# Patient Record
Sex: Male | Born: 1955 | Race: Black or African American | Hispanic: No | Marital: Married | Smoking: Never smoker
Health system: Southern US, Community
[De-identification: ages and names within clinical notes are randomized; demographics above are authoritative.]

## PROBLEM LIST (undated history)

## (undated) DIAGNOSIS — H269 Unspecified cataract: Secondary | ICD-10-CM

## (undated) DIAGNOSIS — H409 Unspecified glaucoma: Secondary | ICD-10-CM

## (undated) DIAGNOSIS — E119 Type 2 diabetes mellitus without complications: Secondary | ICD-10-CM

## (undated) HISTORY — PX: EYE SURGERY: SHX253

## (undated) HISTORY — DX: Unspecified cataract: H26.9

## (undated) HISTORY — PX: KNEE ARTHROSCOPY: SHX127

## (undated) HISTORY — PX: CATARACT EXTRACTION, BILATERAL: SHX1313

## (undated) HISTORY — PX: ORIF ORBITAL FRACTURE: SHX5312

---

## 2003-04-13 ENCOUNTER — Emergency Department (HOSPITAL_COMMUNITY): Admission: EM | Admit: 2003-04-13 | Discharge: 2003-04-13 | Payer: Self-pay | Admitting: Emergency Medicine

## 2003-04-15 ENCOUNTER — Observation Stay (HOSPITAL_COMMUNITY): Admission: AD | Admit: 2003-04-15 | Discharge: 2003-04-16 | Payer: Self-pay | Admitting: Orthopedic Surgery

## 2009-02-16 ENCOUNTER — Ambulatory Visit (HOSPITAL_COMMUNITY): Admission: AD | Admit: 2009-02-16 | Discharge: 2009-02-17 | Payer: Self-pay | Admitting: Orthopedic Surgery

## 2010-09-25 LAB — HEMOGLOBIN AND HEMATOCRIT, BLOOD
HCT: 38.4 % — ABNORMAL LOW (ref 39.0–52.0)
Hemoglobin: 13 g/dL (ref 13.0–17.0)

## 2010-11-02 NOTE — Op Note (Signed)
NAMEJOVONTA, Randy Mcbride              ACCOUNT NO.:  [id number]   MEDICAL RECORD NO.:  [id number]          PATIENT TYPE:  AMB   LOCATION:  DAY                          FACILITY:  [facility]   PHYSICIAN:  Marlowe Kays, M.D.  DATE OF BIRTH:  May 11, 1956   DATE OF PROCEDURE:  02/16/2009  DATE OF DISCHARGE:                               OPERATIVE REPORT   PREOPERATIVE DIAGNOSIS:  Torn patellar tendon, left knee.   POSTOPERATIVE DIAGNOSIS:  Torn patellar tendon, left knee.   OPERATION:  Repair of torn patellar tendon, left knee.   SURGEON:  Marlowe Kays, M.D.   ASSISTANT:  Georges Lynch. Darrelyn Hillock, M.D.   ANESTHESIA:  General.   INDICATIONS:  The injury actually occurred on 08/27 and he was seen at  an outpatient facility locally.  I was on call that night and was  contacted and saw him early this morning with plans to repair the tendon  this afternoon.  This afternoon.   DESCRIPTION OF PROCEDURE:  Prophylactic antibiotics, satisfactory  general anesthesia, pneumatic tourniquet applied, leg was esmarched out  non-sterilely and tourniquet inflated to 350 mmHg.  Left leg was then  prepped from tourniquet to ankle with DuraPrep.  Sterile field.  Vertical midline incision from about the quadriceps tendon insertion to  down to the tibial tubercle.  Decision was carried down to the patella  and the badly disrupted patellar tendon.  There was small remnants left  on the patella.  I first elevated up the remaining tissue on the patella  and scraped the inferior pole of patella with small rongeur, leaving the  articular surface intact.  I then made for __________ drill holes,  roughly spaced evenly across the inferior portion of the patella, coming  out superiorly.  I wove a #2 Ethibond suture with free #2 Ethibond  sutures through the patellar tendon stump distally and then using the  beef pin, placed one thread through each of the lateral most holes and  two threads through the two center holes  along with one portion of  thread from each of the lateral holes so that we could cinch down the  tendon proximally with four threads through three holes.  We were able  to cinch down the tendon nicely at the superior patella.  Patella was  checked and was not riding high.  Then irrigated it well with sterile  saline.  We closed the retinaculum laterally and remnants of the  patellar tendon in the midline smoothing down the patellar tendon and  retinaculum with a stable repair, all with #1 Vicryl.  Subcutaneous  tissue was then closed with 2-0 Vicryl staples in the skin.  Adaptic dry  sterile dressing with a well-padded fiberglass cast were applied.  Tourniquet was released with the 68 minutes of tourniquet time having  elapsed.  He tolerated the procedure well, was taken to the recovery  room in satisfactory condition with no known complications and no blood  loss.          ______________________________  Marlowe Kays, M.D.    JA/MEDQ  D:  02/16/2009  T:  02/16/2009  Job:  [id number]

## 2010-11-05 NOTE — Op Note (Signed)
NAME:  Randy Mcbride, Randy Mcbride                        ACCOUNT NO.:  [id number]   MEDICAL RECORD NO.:  [id number]                   PATIENT TYPE:  OBV   LOCATION:  0471                                 FACILITY:  [facility]   PHYSICIAN:  Almedia Balls. Ranell Patrick, M.D.              DATE OF BIRTH:  [birth year]   DATE OF PROCEDURE:  04/15/2003  DATE OF DISCHARGE:                                 OPERATIVE REPORT   PREOPERATIVE DIAGNOSIS:  Right quadriceps rupture.   POSTOPERATIVE DIAGNOSIS:  Right quadriceps rupture.   PROCEDURE:  Right open quadriceps repair.   SURGEON:  Almedia Balls. Ranell Patrick, M.D.   FIRST ASSISTANT:  None.   ANESTHESIA:  General anesthesia was used.   ESTIMATED BLOOD LOSS:  Zero.   TOURNIQUET TIME:  56 minutes.   INSTRUMENT COUNT:  Correct.   COMPLICATIONS:  None.   ANTIBIOTICS:  Perioperative antibiotics were given.   INDICATIONS FOR PROCEDURE:  The patient is a 55 year old male who sustained  an injury to his right knee. The patient was complaining of immediate pain  and inability to walk. The patient has inability to actively extend his knee  in the office and a palpable defect is present in the superior pole of the  patella consistent with quadriceps rupture. Plain films are negative.  Informed consent was obtained.   DESCRIPTION OF PROCEDURE:  After an adequate level anesthesia was achieved,  the patient was positioned supine on the operating room table and a  nonsterile tourniquet was placed on the right proximal thigh, right leg was  prepped and draped in its entirety in the usual sterile fashion. After  exsanguination of the limb using an Esmarch bandage, the tourniquet was  elevated to 300 mmHg, a longitudinal skin incision was created starting at  the distal aspect of the quadriceps, extending over the patella. This was in  the midline. Dissection was carried sharply down through the subcutaneous  tissue using Bovie electrocautery. The quadriceps was noted to be  completely  ruptured from the superior pole of the patella, the quadriceps tendon was  freshened up as well as the remaining soft tissue removed down off the  superior pole of the patella exposing cortical bleeding bone, three drill  holes placed proximal to distal utilizing a 2 mm drill bit to facilitate  passage of the FiberWire suture. A #5 FiberWire suture was placed in a  Krakow-type stitch up into the quadriceps tendon such that four strands were  exiting distally. These were then passed through the three drill holes such  that the mattress orientation could be tied under the tendon distally. With  the knee hyperextended and after a thorough irrigation of the knee joint,  the repair was affected. At this point, the retinaculum was repaired using  #2 FiberWire run from the deep portion of the medial gutter up to the tissue  adjacent to the patellar tendon repair. A nice low profile repair  with a  flat patella was obtained. The knee was thoroughly irrigated and then closed  using 2-0 Vicryl for subcutaneous tissues and running 4-0 Monocryl to the  skin in a subcuticular fashion. Steri-Strips were applied followed by a  sterile dressing and a long leg soft wrap. The patient was taken to the  recovery room after deflating the tourniquet for six minutes in stable  condition and extubated.                                               Almedia Balls. Ranell Patrick, M.D.    SRN/MEDQ  D:  04/15/2003  T:  04/15/2003  Job:  [id number]

## 2011-08-04 ENCOUNTER — Other Ambulatory Visit: Payer: Self-pay | Admitting: Family Medicine

## 2011-08-04 ENCOUNTER — Other Ambulatory Visit: Payer: Self-pay | Admitting: Physician Assistant

## 2012-04-13 ENCOUNTER — Other Ambulatory Visit: Payer: Self-pay | Admitting: Physician Assistant

## 2012-04-13 NOTE — Telephone Encounter (Signed)
Chart pulled to PA pool at nurses station [phone]

## 2012-04-14 ENCOUNTER — Other Ambulatory Visit: Payer: Self-pay | Admitting: Physician Assistant

## 2013-11-06 ENCOUNTER — Encounter (HOSPITAL_COMMUNITY): Payer: Self-pay | Admitting: Emergency Medicine

## 2013-11-06 ENCOUNTER — Emergency Department (HOSPITAL_COMMUNITY)
Admission: EM | Admit: 2013-11-06 | Discharge: 2013-11-06 | Disposition: A | Discharge status: Nursing Home (Includes ICF) | Payer: 59 | Source: Home / Self Care

## 2013-11-06 DIAGNOSIS — R32 Unspecified urinary incontinence: Secondary | ICD-10-CM

## 2013-11-06 DIAGNOSIS — R519 Headache, unspecified: Secondary | ICD-10-CM

## 2013-11-06 DIAGNOSIS — R51 Headache: Secondary | ICD-10-CM

## 2013-11-06 DIAGNOSIS — R269 Unspecified abnormalities of gait and mobility: Secondary | ICD-10-CM | POA: Insufficient documentation

## 2013-11-06 DIAGNOSIS — F1010 Alcohol abuse, uncomplicated: Secondary | ICD-10-CM | POA: Insufficient documentation

## 2013-11-06 DIAGNOSIS — E119 Type 2 diabetes mellitus without complications: Secondary | ICD-10-CM | POA: Insufficient documentation

## 2013-11-06 DIAGNOSIS — R42 Dizziness and giddiness: Secondary | ICD-10-CM

## 2013-11-06 HISTORY — DX: Type 2 diabetes mellitus without complications: E11.9

## 2013-11-06 HISTORY — DX: Unspecified glaucoma: H40.9

## 2013-11-06 LAB — CBC WITH DIFFERENTIAL/PLATELET
Basophils Absolute: 0 10*3/uL (ref 0.0–0.1)
Basophils Relative: 0 % (ref 0–1)
Eosinophils Absolute: 0.1 10*3/uL (ref 0.0–0.7)
Eosinophils Relative: 2 % (ref 0–5)
HCT: 42.2 % (ref 39.0–52.0)
Hemoglobin: 14 g/dL (ref 13.0–17.0)
Lymphocytes Relative: 43 % (ref 12–46)
Lymphs Abs: 2.8 10*3/uL (ref 0.7–4.0)
MCH: 29 pg (ref 26.0–34.0)
MCHC: 33.2 g/dL (ref 30.0–36.0)
MCV: 87.6 fL (ref 78.0–100.0)
Monocytes Absolute: 0.3 10*3/uL (ref 0.1–1.0)
Monocytes Relative: 5 % (ref 3–12)
Neutro Abs: 3.3 10*3/uL (ref 1.7–7.7)
Neutrophils Relative %: 50 % (ref 43–77)
Platelets: 353 10*3/uL (ref 150–400)
RBC: 4.82 MIL/uL (ref 4.22–5.81)
RDW: 11.5 % (ref 11.5–15.5)
WBC: 6.5 10*3/uL (ref 4.0–10.5)

## 2013-11-06 LAB — BASIC METABOLIC PANEL
BUN: 10 mg/dL (ref 6–23)
CO2: 25 mEq/L (ref 19–32)
Calcium: 9.1 mg/dL (ref 8.4–10.5)
Chloride: 103 mEq/L (ref 96–112)
Creatinine, Ser: 0.94 mg/dL (ref 0.50–1.35)
GFR calc Af Amer: >90 mL/min (ref >90)
GFR calc non Af Amer: 89 mL/min — ABNORMAL LOW (ref >90)
Glucose, Bld: 228 mg/dL — ABNORMAL HIGH (ref 70–99)
Potassium: 4 mEq/L (ref 3.7–5.3)
Sodium: 141 mEq/L (ref 137–147)

## 2013-11-06 LAB — CBG MONITORING, ED: Glucose-Capillary: 192 mg/dL — ABNORMAL HIGH (ref 70–99)

## 2013-11-06 NOTE — ED Notes (Addendum)
Pt's wife brings pt b/c she found him on the floor unresponsive Pt awoke and urinated on himself and was c/o HA and feeling dizzy Pt reports he was drinking "2-3 Gin"?? Pt reports he feels fine and wants to go home but is here b/c of wife.  He is alert w/some slurry speech; no signs of acute distress.

## 2013-11-06 NOTE — ED Notes (Addendum)
[facility], slurred speech and headache that began after drinking a pint of gin today around 5 pm. WIfe was conerned and wanted him to be evaluated because staggering, slurred speech and headache are uncommon for him. Pt denies complaints, he is alert and oreinted., MAE x4 no slurred speech or facial droop. Denies headaches.  Wife reports he urinated on himself and c/o of headache she became concerned.  PT reports htat he does not usually drink that much. Also reports cough for 3 weeks.  [facility]

## 2013-11-06 NOTE — ED Provider Notes (Signed)
CSN: [id number]     Arrival date & time 11/06/13  1957 History   None    Chief Complaint  Patient presents with  . Alcohol Intoxication   (Consider location/radiation/quality/duration/timing/severity/associated sxs/prior Treatment) HPI  {Pt states he is here because his wife brought him here. Pt is complaining of HA, dizziness, and slurring words, and urinated on himself. Endorses drinking a pint of gin and denies. Wife states pt has problems with being honest.   Past Medical History  Diagnosis Date  . Glaucoma   . Diabetes mellitus without complication    Past Surgical History  Procedure Laterality Date  . Knee arthroscopy     No family history on file. History  Substance Use Topics  . Smoking status: Never Smoker   . Smokeless tobacco: Not on file  . Alcohol Use: Yes    Review of Systems  Respiratory: Negative for shortness of breath.   Cardiovascular: Negative for chest pain.  Neurological: Positive for dizziness and headaches. Negative for seizures, syncope, light-headedness and numbness.  All other systems reviewed and are negative.   Allergies  Review of patient's allergies indicates no known allergies.  Home Medications   Prior to Admission medications   Medication Sig Start Date End Date Taking? Authorizing Provider  ONE TOUCH ULTRA TEST test strip AS DIRECTED 04/13/12   Sondra Bargesyan M Dunn, PA-C  ONETOUCH DELICA LANCETS MISC USE  STRIP TO CHECK GLUCOSE TWICE DAILY 08/04/11   Morrell RiddleSarah L Weber, PA-C   There were no vitals taken for this visit. Physical Exam  Constitutional: He is oriented to person, place, and time. He appears well-developed and well-nourished. No distress.  HENT:  Head: Normocephalic and atraumatic.  Eyes: EOM are normal. Pupils are equal, round, and reactive to light.  Neck: Normal range of motion.  Cardiovascular: Normal rate and intact distal pulses.   Pulmonary/Chest: Effort normal. No respiratory distress.  Abdominal: Soft. He exhibits no  distension.  Musculoskeletal: Normal range of motion. He exhibits no edema.  Neurological: He is alert and oriented to person, place, and time. No cranial nerve deficit. He exhibits normal muscle tone. Coordination normal.  Wobbly on feet w/ wide gait but no central ataxia.  Upper and lower extremities w/ 5/5 strength.   Skin: Skin is warm. He is not diaphoretic. No erythema.  Psychiatric: He has a normal mood and affect. His behavior is normal. Judgment and thought content normal.    ED Course  Procedures (including critical care time) Labs Review Labs Reviewed - No data to display  Imaging Review No results found.   MDM   1. Headache   2. Incontinence   3. Dizziness and giddiness    Pt condition most concerning for ETOH and other possible drug use. Discordant history per pt and pts wife. No sign of acute ICP/SZR/Stroke.  [facility]   Shelly Flattenavid Merrell, MD Family Medicine PGY-3 11/06/2013, 8:42 PM      Ozella Rocksavid J Merrell, MD 11/06/13 [phone]

## 2013-11-07 ENCOUNTER — Emergency Department (HOSPITAL_COMMUNITY)
Admission: EM | Admit: 2013-11-07 | Discharge: 2013-11-07 | Discharge status: Against Medical Advice | Payer: 59 | Attending: Emergency Medicine | Admitting: Emergency Medicine

## 2013-11-07 NOTE — ED Notes (Signed)
Pt did not answer when name called. Unable to find in waiting room

## 2013-11-07 NOTE — ED Provider Notes (Signed)
Medical screening examination/treatment/procedure(s) were performed by a resident physician or non-physician practitioner and as the supervising physician I was immediately available for consultation/collaboration.  Joylyn Duggin, MD    Shade Kaley S Rosaline Ezekiel, MD 11/07/13 0751 

## 2013-12-25 ENCOUNTER — Ambulatory Visit (INDEPENDENT_AMBULATORY_CARE_PROVIDER_SITE_OTHER): Payer: 59 | Admitting: Family Medicine

## 2013-12-25 VITALS — BP 104/80 | HR 70 | Temp 97.8°F | Resp 16 | Ht 67.25 in | Wt 184.0 lb

## 2013-12-25 DIAGNOSIS — E1165 Type 2 diabetes mellitus with hyperglycemia: Secondary | ICD-10-CM

## 2013-12-25 DIAGNOSIS — E11319 Type 2 diabetes mellitus with unspecified diabetic retinopathy without macular edema: Secondary | ICD-10-CM | POA: Insufficient documentation

## 2013-12-25 DIAGNOSIS — E119 Type 2 diabetes mellitus without complications: Secondary | ICD-10-CM | POA: Insufficient documentation

## 2013-12-25 DIAGNOSIS — E118 Type 2 diabetes mellitus with unspecified complications: Principal | ICD-10-CM

## 2013-12-25 DIAGNOSIS — IMO0002 Reserved for concepts with insufficient information to code with codable children: Secondary | ICD-10-CM

## 2013-12-25 LAB — POCT GLYCOSYLATED HEMOGLOBIN (HGB A1C): Hemoglobin A1C: 7.6

## 2013-12-25 MED ORDER — ONETOUCH ULTRASOFT LANCETS MISC: Status: DC

## 2013-12-25 MED ORDER — GLUCOSE BLOOD VI STRP: ORAL_STRIP | Status: DC

## 2013-12-25 MED ORDER — METFORMIN HCL 500 MG PO TABS: 500.0000 mg | ORAL_TABLET | Freq: Every day | ORAL | Status: DC

## 2013-12-25 NOTE — Progress Notes (Signed)
[facility] [address], [place] [address] [phone]  Date:  12/25/2013   Name:  Randy MemosRonald D Ney   DOB:  02/10/1956   MRN:  [id number]  PCP:  No PCP Per Patient    Chief Complaint: Annual Exam   History of Present Illness:  Randy MemosRonald D Jacuinde is a 58 y.o. very pleasant male patient who presents with the following:  He is here today for a CPE/ to establish care.  He is a new patient.  He has a history of DM but is not currently on any meds.  Took metformin in the past He also does have glaucoma, but has not seen an eye doctor in a couple of years either.  He does still have his drops.    It looks like he was in the ER back in May after being found ?intoxicated by his wife at home.  He had some labs then which looked ok except for hyperglycemia.  He is fasting today- since 10pm.   He is otherwise generally healthy per his knowledge.   There are no active problems to display for this patient.   Past Medical History  Diagnosis Date  . Glaucoma   . Diabetes mellitus without complication   . Cataract     Past Surgical History  Procedure Laterality Date  . Knee arthroscopy    . Eye surgery      History  Substance Use Topics  . Smoking status: Never Smoker   . Smokeless tobacco: Not on file  . Alcohol Use: Yes    Family History  Problem Relation Age of Onset  . Diabetes Mother   . Stroke Father   . Diabetes Maternal Grandmother   . Hypertension Paternal Grandfather     No Known Allergies  Medication list has been reviewed and updated.  Current Outpatient Prescriptions on File Prior to Visit  Medication Sig Dispense Refill  . ONE TOUCH ULTRA TEST test strip AS DIRECTED  32 each  0  . ONETOUCH DELICA LANCETS MISC USE  STRIP TO CHECK GLUCOSE TWICE DAILY  100 each  0   No current facility-administered medications on file prior to visit.    Review of Systems:  As per HPI- otherwise negative.   Physical Examination: Filed Vitals:   12/25/13 1343  BP: 104/80  Pulse: 70  Temp: 97.8 F (36.6 C)  Resp: 16   Filed Vitals:   12/25/13 1343  Height: 5' 7.25" (1.708 m)  Weight: 184 lb (83.462 kg)   Body mass index is 28.61 kg/(m^2). Ideal Body Weight: Weight in (lb) to have BMI = 25: 160.5  GEN: WDWN, NAD, Non-toxic, A & O x 3, looks well HEENT: Atraumatic, Normocephalic. Neck supple. No masses, No LAD. Ears and Nose: No external deformity. CV: RRR, No M/G/R. No JVD. No thrill. No extra heart sounds. PULM: CTA B, no wheezes, crackles, rhonchi. No retractions. No resp. distress. No accessory muscle use. ABD: S, NT, ND EXTR: No c/c/e NEURO Normal gait.  PSYCH: Normally interactive. Conversant. Not depressed or anxious appearing.  Calm demeanor.  Gu: normal exam, no masses or lesions.    Results for orders placed in visit on 12/25/13  POCT GLYCOSYLATED HEMOGLOBIN (HGB A1C)      Result Value Ref Range   Hemoglobin A1C 7.6      Assessment and Plan: Type II or unspecified type diabetes mellitus with unspecified complication, uncontrolled - Plan: POCT glycosylated hemoglobin (Hb A1C), Comprehensive metabolic panel, Lipid  panel, PSA, metFORMIN (GLUCOPHAGE) 500 MG tablet  DM- off meds but A1c is not bad.  Restart metformin 500 mg a day, and await other labs.  Plan follow-up in 3-4 months.   Signed Abbe AmsterdamJessica Jerae Izard, MD

## 2013-12-25 NOTE — Patient Instructions (Signed)
Good to see you today.  Start back on metformin 500 mg once a day, and please come and see me in 3 or 4 months for a recheck. I will be in touch with the rest of your labs.

## 2013-12-25 NOTE — Addendum Note (Signed)
Addended by: Abbe AmsterdamOPLAND, Tamika Nou C on: 12/25/2013 03:46 PM   Modules accepted: Orders

## 2013-12-26 ENCOUNTER — Encounter: Payer: Self-pay | Admitting: Family Medicine

## 2013-12-26 LAB — COMPREHENSIVE METABOLIC PANEL
ALT: 22 U/L (ref 0–53)
AST: 18 U/L (ref 0–37)
Albumin: 4.3 g/dL (ref 3.5–5.2)
Alkaline Phosphatase: 70 U/L (ref 39–117)
BUN: 13 mg/dL (ref 6–23)
CO2: 26 mEq/L (ref 19–32)
Calcium: 9.4 mg/dL (ref 8.4–10.5)
Chloride: 99 mEq/L (ref 96–112)
Creat: 0.94 mg/dL (ref 0.50–1.35)
Glucose, Bld: 164 mg/dL — ABNORMAL HIGH (ref 70–99)
Potassium: 4.6 mEq/L (ref 3.5–5.3)
Sodium: 135 mEq/L (ref 135–145)
Total Bilirubin: 0.7 mg/dL (ref 0.2–1.2)
Total Protein: 7 g/dL (ref 6.0–8.3)

## 2013-12-26 LAB — LIPID PANEL
Cholesterol: 201 mg/dL — ABNORMAL HIGH (ref 0–200)
HDL: 43 mg/dL (ref >39)
LDL Cholesterol: 143 mg/dL — ABNORMAL HIGH (ref 0–99)
Total CHOL/HDL Ratio: 4.7 Ratio
Triglycerides: 75 mg/dL (ref <150)
VLDL: 15 mg/dL (ref 0–40)

## 2013-12-26 LAB — PSA: PSA: 0.72 ng/mL (ref <=4.00)

## 2014-01-08 ENCOUNTER — Ambulatory Visit: Payer: Self-pay | Admitting: Family Medicine

## 2014-04-14 ENCOUNTER — Ambulatory Visit: Payer: 59 | Admitting: Family Medicine

## 2014-06-09 ENCOUNTER — Ambulatory Visit (INDEPENDENT_AMBULATORY_CARE_PROVIDER_SITE_OTHER): Payer: 59 | Admitting: Family Medicine

## 2014-06-09 VITALS — BP 144/88 | HR 84 | Temp 98.6°F | Resp 18 | Ht 67.25 in | Wt 186.6 lb

## 2014-06-09 DIAGNOSIS — N481 Balanitis: Secondary | ICD-10-CM

## 2014-06-09 MED ORDER — FLUCONAZOLE 150 MG PO TABS: 150.0000 mg | ORAL_TABLET | Freq: Once | ORAL | Status: DC

## 2014-06-09 MED ORDER — KETOCONAZOLE 2 % EX CREA: 1.0000 "application " | TOPICAL_CREAM | Freq: Two times a day (BID) | CUTANEOUS | Status: DC

## 2014-06-09 NOTE — Progress Notes (Signed)
Is a 58 year old retired man who has diabetes and presents with chronic balanitis for a year. He's been checking his sugar and found it to be well controlled. The balanitis is intermittent but has been worse lately. He is able to retract his foreskin.  Objective: No acute distress Examination of the penis moderate erythema over the foreskin and glans with some fissuring as he retracts the foreskin.  This chart was scribed in my presence and reviewed by me personally.    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

## 2014-06-09 NOTE — Patient Instructions (Signed)
This is a mild fungal infection commonly seen in diabetes.    Balanitis Balanitis is inflammation of the head of the penis (glans).  CAUSES  Balanitis has multiple causes, both infectious and noninfectious. Often balanitis is the result of poor personal hygiene, especially in uncircumcised males. Without adequate washing, viruses, bacteria, and yeast collect between the foreskin and the glans. This can cause an infection. Lack of air and irritation from a normal secretion called smegma contribute to the cause in uncircumcised males. Other causes include:  Chemical irritation from the use of certain soaps and shower gels (especially soaps with perfumes), condoms, personal lubricants, petroleum jelly, spermicides, and fabric conditioners.  Skin conditions, such as eczema, dermatitis, and psoriasis.  Allergies to drugs, such as tetracycline and sulfa.  Certain medical conditions, including liver cirrhosis, congestive heart failure, and kidney disease.  Morbid obesity. RISK FACTORS  Diabetes mellitus.  A tight foreskin that is difficult to pull back past the glans (phimosis).  Sex without the use of a condom. SIGNS AND SYMPTOMS  Symptoms may include:  Discharge coming from under the foreskin.  Tenderness.  Itching and inability to get an erection (because of the pain).  Redness and a rash.  Sores on the glans and on the foreskin. DIAGNOSIS Diagnosis of balanitis is confirmed through a physical exam. TREATMENT The treatment is based on the cause of the balanitis. Treatment may include:  Frequent cleansing.  Keeping the glans and foreskin dry.  Use of medicines such as creams, pain medicines, antibiotics, or medicines to treat fungal infections.  Sitz baths. If the irritation has caused a scar on the foreskin that prevents easy retraction, a circumcision may be recommended.  HOME CARE INSTRUCTIONS  Sex should be avoided until the condition has cleared. MAKE SURE  YOU:  Understand these instructions.  Will watch your condition.  Will get help right away if you are not doing well or get worse. Document Released: 10/23/2008 Document Revised: 06/11/2013 Document Reviewed: 11/26/2012 [facility] Patient Information 2015 [employer] [place]. This information is not intended to replace advice given to you by your health care provider. Make sure you discuss any questions you have with your health care provider.

## 2014-10-01 DIAGNOSIS — H401133 Primary open-angle glaucoma, bilateral, severe stage: Secondary | ICD-10-CM | POA: Insufficient documentation

## 2014-11-13 ENCOUNTER — Ambulatory Visit (INDEPENDENT_AMBULATORY_CARE_PROVIDER_SITE_OTHER): Payer: 59 | Admitting: Family Medicine

## 2014-11-13 VITALS — BP 130/80 | HR 64 | Temp 97.8°F | Resp 18 | Ht 67.75 in | Wt 177.8 lb

## 2014-11-13 DIAGNOSIS — E1165 Type 2 diabetes mellitus with hyperglycemia: Secondary | ICD-10-CM | POA: Diagnosis not present

## 2014-11-13 DIAGNOSIS — Z130 Encounter for screening for diseases of the blood and blood-forming organs and certain disorders involving the immune mechanism: Secondary | ICD-10-CM | POA: Diagnosis not present

## 2014-11-13 DIAGNOSIS — Z23 Encounter for immunization: Secondary | ICD-10-CM | POA: Diagnosis not present

## 2014-11-13 DIAGNOSIS — Z13220 Encounter for screening for lipoid disorders: Secondary | ICD-10-CM | POA: Diagnosis not present

## 2014-11-13 DIAGNOSIS — IMO0002 Reserved for concepts with insufficient information to code with codable children: Secondary | ICD-10-CM

## 2014-11-13 DIAGNOSIS — Z1211 Encounter for screening for malignant neoplasm of colon: Secondary | ICD-10-CM | POA: Diagnosis not present

## 2014-11-13 DIAGNOSIS — Z119 Encounter for screening for infectious and parasitic diseases, unspecified: Secondary | ICD-10-CM

## 2014-11-13 DIAGNOSIS — Z125 Encounter for screening for malignant neoplasm of prostate: Secondary | ICD-10-CM

## 2014-11-13 DIAGNOSIS — Z0000 Encounter for general adult medical examination without abnormal findings: Secondary | ICD-10-CM | POA: Diagnosis not present

## 2014-11-13 LAB — CBC
HCT: 39.9 % (ref 39.0–52.0)
Hemoglobin: 13.4 g/dL (ref 13.0–17.0)
MCH: 29.4 pg (ref 26.0–34.0)
MCHC: 33.6 g/dL (ref 30.0–36.0)
MCV: 87.5 fL (ref 78.0–100.0)
MPV: 10.6 fL (ref 8.6–12.4)
Platelets: 293 10*3/uL (ref 150–400)
RBC: 4.56 MIL/uL (ref 4.22–5.81)
RDW: 12.4 % (ref 11.5–15.5)
WBC: 4.6 10*3/uL (ref 4.0–10.5)

## 2014-11-13 LAB — POCT GLYCOSYLATED HEMOGLOBIN (HGB A1C): Hemoglobin A1C: 6.1

## 2014-11-13 LAB — LIPID PANEL
Cholesterol: 155 mg/dL (ref 0–200)
HDL: 44 mg/dL (ref >=40)
LDL Cholesterol: 90 mg/dL (ref 0–99)
Total CHOL/HDL Ratio: 3.5 Ratio
Triglycerides: 105 mg/dL (ref <150)
VLDL: 21 mg/dL (ref 0–40)

## 2014-11-13 LAB — COMPREHENSIVE METABOLIC PANEL
ALT: 22 U/L (ref 0–53)
AST: 20 U/L (ref 0–37)
Albumin: 4.1 g/dL (ref 3.5–5.2)
Alkaline Phosphatase: 59 U/L (ref 39–117)
BUN: 11 mg/dL (ref 6–23)
CO2: 22 mEq/L (ref 19–32)
Calcium: 9.4 mg/dL (ref 8.4–10.5)
Chloride: 104 mEq/L (ref 96–112)
Creat: 0.9 mg/dL (ref 0.50–1.35)
Glucose, Bld: 160 mg/dL — ABNORMAL HIGH (ref 70–99)
Potassium: 4.4 mEq/L (ref 3.5–5.3)
Sodium: 139 mEq/L (ref 135–145)
Total Bilirubin: 0.5 mg/dL (ref 0.2–1.2)
Total Protein: 6.7 g/dL (ref 6.0–8.3)

## 2014-11-13 LAB — MICROALBUMIN, URINE: Microalb, Ur: 0.4 mg/dL (ref <2.0)

## 2014-11-13 LAB — HIV ANTIBODY (ROUTINE TESTING W REFLEX): HIV 1&2 Ab, 4th Generation: NONREACTIVE

## 2014-11-13 NOTE — Patient Instructions (Signed)
Great to see you today!  Your A1c looks a lot better- continue to take the metformin as you are right now I will be in touch with the rest of your labs I will also refer you to GI because you are due for a colonoscopy (screening test for colon cancer)   As a diabetic, there are several things you can do to monitor your condition and maintain your health.  1. Check your feet daily for any skin breakdown 2. Exercise and keep track of your diet 3. Let us know before you run out of your medications 4. Get your annual flu shot, and ask if you need a pneumonia shot 5. Ask if you are up to date on your labs; you should have an A1c every 6 months, a urine protein test annually, and a cholesterol test annually.  Your doctor may decide to do labs more often if indicated 6. Take off your shoes and socks at each visit.  Be sure your doctor examines your feet.   7. Ask about your blood pressure.  Your goal is 130/ 80 or less 8. Get an annual eye exam.  Please ask your ophthalmologist to send us your report 9. Keep up with your dental cleanings and exams.

## 2014-11-13 NOTE — Progress Notes (Signed)
[facility] [address] [place] [address] [phone]  Date:  11/13/2014   Name:  Randy Mcbride   DOB:  1956/06/10   MRN:  [id number]  PCP:  Abbe Amsterdam, MD    Chief Complaint: Annual Exam   History of Present Illness:  Randy Mcbride is a 59 y.o. very pleasant male patient who presents with the following:  Here today or a CPE.  He is fasting today for labs  History of DM and also eye problems- he sees his ophthalmologist regularly.  Some sort of operation planned for next week- ?cataract His fasting glucose is between 110- 140 He was seen by myself once in July- he has not followed up since.  His A1c then was 7.6 on no medictaion.  He is now taking metformin once a day.    He has not yet had a pneumonia vaccine and tetanus is overdue.   He is not a smoker He is not drinking currently Colonoscopy in 2014  Patient Active Problem List   Diagnosis Date Noted  . Type II or unspecified type diabetes mellitus with unspecified complication, uncontrolled 12/25/2013    Past Medical History  Diagnosis Date  . Glaucoma   . Diabetes mellitus without complication   . Cataract     Past Surgical History  Procedure Laterality Date  . Knee arthroscopy    . Eye surgery      History  Substance Use Topics  . Smoking status: Never Smoker   . Smokeless tobacco: Not on file  . Alcohol Use: 0.0 oz/week    0 Standard drinks or equivalent per week    Family History  Problem Relation Age of Onset  . Diabetes Mother   . Stroke Father   . Diabetes Maternal Grandmother   . Hypertension Paternal Grandfather     No Known Allergies  Medication list has been reviewed and updated.  Current Outpatient Prescriptions on File Prior to Visit  Medication Sig Dispense Refill  . dorzolamide-timolol (COSOPT) 22.3-6.8 MG/ML ophthalmic solution Place 1 drop into both eyes 2 (two) times daily.    . fluconazole (DIFLUCAN) 150 MG tablet Take 1 tablet (150 mg  total) by mouth once. 1 tablet 11  . glucose blood (ONE TOUCH ULTRA TEST) test strip Use daily as needed 100 each 3  . Lancets (ONETOUCH ULTRASOFT) lancets Use as instructed 100 each 12  . metFORMIN (GLUCOPHAGE) 500 MG tablet Take 1 tablet (500 mg total) by mouth daily with breakfast. 90 tablet 3  . ONETOUCH DELICA LANCETS MISC USE  STRIP TO CHECK GLUCOSE TWICE DAILY 100 each 0   No current facility-administered medications on file prior to visit.    Review of Systems:  As per HPI- otherwise negative.   Physical Examination: Filed Vitals:   11/13/14 0831  BP: 130/80  Pulse: 64  Temp: 97.8 F (36.6 C)  Resp: 18   Filed Vitals:   11/13/14 0831  Height: 5' 7.75" (1.721 m)  Weight: 177 lb 12.8 oz (80.65 kg)   Body mass index is 27.23 kg/(m^2). Ideal Body Weight: Weight in (lb) to have BMI = 25: 162.9  GEN: WDWN, NAD, Non-toxic, A & O x 3, mild overweight, looks well HEENT: Atraumatic, Normocephalic. Neck supple. No masses, No LAD. Bilateral TM wnl, oropharynx normal.  PEERL,EOMI.   Ears and Nose: No external deformity. CV: RRR, No M/G/R. No JVD. No thrill. No extra heart sounds. PULM: CTA B, no wheezes, crackles, rhonchi. No retractions.  No resp. distress. No accessory muscle use. ABD: S, NT, ND, +BS. No rebound. No HSM. EXTR: No c/c/e NEURO Normal gait.  PSYCH: Normally interactive. Conversant. Not depressed or anxious appearing.  Calm demeanor.   Foot exam normal today  Results for orders placed or performed in visit on 11/13/14  POCT glycosylated hemoglobin (Hb A1C)  Result Value Ref Range   Hemoglobin A1C 6.1     Assessment and Plan: Physical exam  Diabetes mellitus type 2, uncontrolled - Plan: Microalbumin, urine, Comprehensive metabolic panel, POCT glycosylated hemoglobin (Hb A1C)  Screening for hyperlipidemia - Plan: Lipid panel  Screening for deficiency anemia - Plan: CBC  Screening for prostate cancer - Plan: PSA  Screening examination for infectious  disease - Plan: HIV antibody  Immunization due - Plan: Tdap vaccine greater than or equal to 7yo IM, Pneumococcal polysaccharide vaccine 23-valent greater than or equal to 2yo subcutaneous/IM  Screening for colon cancer - Plan: Ambulatory referral to Gastroenterology  Caught up on health maint as above.  Labs pending.  DM control is good.  Immunizations as above Depending on his other labs plan follow-up in 6 months   Signed Abbe AmsterdamJessica Copland, MD

## 2014-11-14 ENCOUNTER — Encounter: Payer: Self-pay | Admitting: Family Medicine

## 2014-11-14 LAB — PSA: PSA: 1.3 ng/mL (ref <=4.00)

## 2015-02-14 ENCOUNTER — Other Ambulatory Visit: Payer: Self-pay | Admitting: Family Medicine

## 2015-06-05 ENCOUNTER — Other Ambulatory Visit: Payer: Self-pay | Admitting: Family Medicine

## 2015-06-09 ENCOUNTER — Telehealth: Payer: Self-pay

## 2015-06-09 NOTE — Telephone Encounter (Signed)
Patient left a message with pharmacy to have his meds refills.  Hung up without leaving additional information.

## 2015-06-09 NOTE — Telephone Encounter (Signed)
Called back metFORMIN (GLUCOPHAGE) 500 MG tablet  Been out of medication since last Friday.    [facility] [address] [place], [place] - [address]   [phone]

## 2015-06-10 NOTE — Telephone Encounter (Signed)
Rx sent in yesterday

## 2015-07-10 ENCOUNTER — Encounter: Payer: Self-pay | Admitting: Family Medicine

## 2015-07-13 ENCOUNTER — Ambulatory Visit (INDEPENDENT_AMBULATORY_CARE_PROVIDER_SITE_OTHER): Payer: Commercial Managed Care - HMO | Admitting: Physician Assistant

## 2015-07-13 VITALS — BP 154/89 | HR 72 | Temp 98.6°F | Resp 16 | Ht 68.0 in | Wt 184.0 lb

## 2015-07-13 DIAGNOSIS — E538 Deficiency of other specified B group vitamins: Secondary | ICD-10-CM | POA: Diagnosis not present

## 2015-07-13 DIAGNOSIS — E119 Type 2 diabetes mellitus without complications: Secondary | ICD-10-CM

## 2015-07-13 DIAGNOSIS — R030 Elevated blood-pressure reading, without diagnosis of hypertension: Secondary | ICD-10-CM | POA: Diagnosis not present

## 2015-07-13 DIAGNOSIS — IMO0001 Reserved for inherently not codable concepts without codable children: Secondary | ICD-10-CM

## 2015-07-13 DIAGNOSIS — H409 Unspecified glaucoma: Secondary | ICD-10-CM | POA: Insufficient documentation

## 2015-07-13 DIAGNOSIS — Z23 Encounter for immunization: Secondary | ICD-10-CM | POA: Diagnosis not present

## 2015-07-13 LAB — POCT GLYCOSYLATED HEMOGLOBIN (HGB A1C): Hemoglobin A1C: 7.4

## 2015-07-13 MED ORDER — METFORMIN HCL 500 MG PO TABS
ORAL_TABLET | ORAL | Status: DC
Start: 2015-07-13 — End: 2016-08-02

## 2015-07-13 NOTE — Progress Notes (Signed)
[facility] [address] [place] [address] [phone]  Date:  07/13/2015   Name:  Randy Mcbride   DOB:  Jan 28, 1956   MRN:  [id number]  PCP:  Abbe Amsterdam, MD    Chief Complaint: Medication Refill and Flu Vaccine   History of Present Illness:  This is a 60 y.o. male with PMH DM2, B12 deficiency, glaucoma who is presenting for follow up DM.  Takes metformin 500 mg QAM. Last A1C 11/13/14 - 6.1. microalbumin at that time normal. Normal kidney and liver function. Had pneumovax 10/2014. Had diabetic foot exam 10/2614. Last ophtho exam June 2016.  Only checks sugar when he feels bad. 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 Used to do  weight lifting after the 45 minutes classes for 20 minutes or so but states he hasn't been as good about doing this over the past 1 year. Diet - states he has slipped a little and eats more fried foods and not drinking as much water. Still eating a lot of vegetables. Has an upcoming appt with Dr. Patsy Lager in Feb. Denies paresthesias.  Lipid panel 10/2014 normal.  BP elevated today. Has not been elevated in the past. Pt is not open to taking BP meds. He denies ha, dizziness, blurred, vision, cp, sob.  Review of Systems:  Review of Systems See HPI  Patient Active Problem List   Diagnosis Date Noted  . Type II or unspecified type diabetes mellitus with unspecified complication, uncontrolled 12/25/2013    Prior to Admission medications   Medication Sig Start Date End Date Taking? Authorizing Provider  dorzolamide-timolol (COSOPT) 22.3-6.8 MG/ML ophthalmic solution Place 1 drop into both eyes 2 (two) times daily.   Yes Historical Provider, MD  glucose blood (ONE TOUCH ULTRA TEST) test strip Use daily as needed 12/25/13  Yes Gwenlyn Found Copland, MD  Lancets (ONETOUCH ULTRASOFT) lancets Use as instructed 12/25/13  Yes Gwenlyn Found Copland, MD  metFORMIN (GLUCOPHAGE) 500 MG tablet Take one tablet by mouth once daily with  breakfast  "NO MORE REFILLS WITHOUT OFFICE VISIT" 06/07/15  Yes Gwenlyn Found Copland, MD  Multiple Vitamin (MULTIVITAMIN) tablet Take 1 tablet by mouth daily.   Yes Historical Provider, MD  [facility] DELICA LANCETS MISC USE  STRIP TO CHECK GLUCOSE TWICE DAILY 08/04/11  Yes Morrell Riddle, PA-C  vitamin B-12 (CYANOCOBALAMIN) 500 MCG tablet Take 500 mcg by mouth daily.   Yes Historical Provider, MD    No Known Allergies  Past Surgical History  Procedure Laterality Date  . Knee arthroscopy    . Eye surgery      Social History  Substance Use Topics  . Smoking status: Never Smoker   . Smokeless tobacco: None  . Alcohol Use: 0.0 oz/week    0 Standard drinks or equivalent per week    Family History  Problem Relation Age of Onset  . Diabetes Mother   . Stroke Father   . Diabetes Maternal Grandmother   . Hypertension Paternal Grandfather     Medication list has been reviewed and updated.  Physical Examination:  Physical Exam  Constitutional: He is oriented to person, place, and time. He appears well-developed and well-nourished. No distress.  HENT:  Head: Normocephalic and atraumatic.  Right Ear: Hearing normal.  Left Ear: Hearing normal.  Nose: Nose normal.  Eyes: Conjunctivae and lids are normal. Right eye exhibits no discharge. Left eye exhibits no discharge. No scleral icterus.  Neck: Carotid bruit is not present.  No thyromegaly present.  Cardiovascular: Normal rate, regular rhythm, normal heart sounds and normal pulses.   No murmur heard. Pulmonary/Chest: Effort normal. No respiratory distress. He has no wheezes. He has no rhonchi. He has no rales.  Musculoskeletal: Normal range of motion.  Lymphadenopathy:    He has no cervical adenopathy.  Neurological: He is alert and oriented to person, place, and time.  Skin: Skin is warm, dry and intact. No lesion and no rash noted.  Foot inspection normal  Psychiatric: He has a normal mood and affect. His speech is normal and behavior  is normal. Thought content normal.   BP 154/89 mmHg  Pulse 72  Temp(Src) 98.6 F (37 C)  Resp 16  Ht  (1.727 m)  Wt 184 lb (83.462 kg)  BMI 27.98 kg/m2  SpO2 98%  Results for orders placed or performed in visit on 07/13/15  POCT glycosylated hemoglobin (Hb A1C)  Result Value Ref Range   Hemoglobin A1C 7.4    Assessment and Plan:  1. Type 2 diabetes mellitus without complication, without long-term current use of insulin (HCC) A1C elevated to 7.4 from 6.1 eight months ago. Advised increasing metformin to 500 mg BID. Pt does not want a medication change at this time. He wants to stay at current dose and do better with diet and exercise. CMP pending. Follow up with copland in 1 month. - Comprehensive metabolic panel - POCT glycosylated hemoglobin (Hb A1C) - metFORMIN (GLUCOPHAGE) 500 MG tablet; Take one tablet by mouth once daily with breakfast  "NO MORE REFILLS WITHOUT OFFICE VISIT"  Dispense: 90 tablet; Refill: 3  2. Needs flu shot - Flu Vaccine QUAD 36+ mos IM  3. Vitamin B12 deficiency - Vitamin B12  4. Elevated BP Elevated BP today but has not been elevated in the past. He wishes to work on lifestyle changes and f/u with Copland in 1 month. Would not be a bad idea to start on low dose lisinopril since has DM.   5. Glaucoma F/u with ophtho.   [place] Brackett, MHS [facility]  07/13/2015

## 2015-07-13 NOTE — Patient Instructions (Signed)
Continue metformin 500 mg each morning. Get back to your exercise routine. Drink more water. Eat healthy. Avoid sugars and carby foods. Follow up with dr. Patsy Lager in February.

## 2015-07-14 LAB — COMPREHENSIVE METABOLIC PANEL
ALT: 19 U/L (ref 9–46)
AST: 16 U/L (ref 10–35)
Albumin: 4.2 g/dL (ref 3.6–5.1)
Alkaline Phosphatase: 56 U/L (ref 40–115)
BUN: 12 mg/dL (ref 7–25)
CO2: 26 mmol/L (ref 20–31)
Calcium: 9.3 mg/dL (ref 8.6–10.3)
Chloride: 104 mmol/L (ref 98–110)
Creat: 1 mg/dL (ref 0.70–1.25)
Glucose, Bld: 127 mg/dL — ABNORMAL HIGH (ref 65–99)
Potassium: 4.5 mmol/L (ref 3.5–5.3)
Sodium: 138 mmol/L (ref 135–146)
Total Bilirubin: 0.5 mg/dL (ref 0.2–1.2)
Total Protein: 6.6 g/dL (ref 6.1–8.1)

## 2015-07-14 LAB — VITAMIN B12: Vitamin B-12: 1008 pg/mL — ABNORMAL HIGH (ref 211–911)

## 2015-07-15 ENCOUNTER — Encounter: Payer: Self-pay | Admitting: Family Medicine

## 2015-07-22 ENCOUNTER — Ambulatory Visit: Payer: 59 | Admitting: Family Medicine

## 2015-09-08 ENCOUNTER — Encounter: Payer: 59 | Admitting: Family Medicine

## 2016-08-02 ENCOUNTER — Telehealth: Payer: Self-pay | Admitting: *Deleted

## 2016-08-02 ENCOUNTER — Ambulatory Visit (INDEPENDENT_AMBULATORY_CARE_PROVIDER_SITE_OTHER): Payer: Commercial Managed Care - HMO | Admitting: Physician Assistant

## 2016-08-02 VITALS — BP 122/76 | HR 67 | Temp 97.4°F | Resp 18 | Ht 68.0 in | Wt 178.8 lb

## 2016-08-02 DIAGNOSIS — E538 Deficiency of other specified B group vitamins: Secondary | ICD-10-CM

## 2016-08-02 DIAGNOSIS — Z13220 Encounter for screening for lipoid disorders: Secondary | ICD-10-CM | POA: Diagnosis not present

## 2016-08-02 DIAGNOSIS — Z0000 Encounter for general adult medical examination without abnormal findings: Secondary | ICD-10-CM

## 2016-08-02 DIAGNOSIS — Z1159 Encounter for screening for other viral diseases: Secondary | ICD-10-CM

## 2016-08-02 DIAGNOSIS — R7309 Other abnormal glucose: Secondary | ICD-10-CM

## 2016-08-02 DIAGNOSIS — E119 Type 2 diabetes mellitus without complications: Secondary | ICD-10-CM

## 2016-08-02 LAB — POCT GLYCOSYLATED HEMOGLOBIN (HGB A1C): Hemoglobin A1C: 10.8

## 2016-08-02 MED ORDER — METFORMIN HCL 500 MG PO TABS: ORAL_TABLET | ORAL | 0 refills | Status: DC

## 2016-08-02 NOTE — Telephone Encounter (Signed)
[facility]ed [facility] spoke to [place] to do 1 refill on Metformin, per CitigroupWhitney McVey.

## 2016-08-02 NOTE — Patient Instructions (Addendum)
Increase Metformin to twice a day. Please take your blood sugar a few times a week and keep a record - because I am going to ask you about it at your next appointment. Schedule your diabetic eye exam with your eye doctor sometime in the next 6 months.   Thank you for coming in today. I hope you feel we met your needs.  Feel free to call [facility] if you have any questions or further requests.  Please consider signing up for MyChart if you do not already have it, as this is a great way to communicate with me.  Best,  Whitney McVey, PA-C  Diabetes Mellitus and Food It is important for you to manage your blood sugar (glucose) level. Your blood glucose level can be greatly affected by what you eat. Eating healthier foods in the appropriate amounts throughout the day at about the same time each day will help you control your blood glucose level. It can also help slow or prevent worsening of your diabetes mellitus. Healthy eating may even help you improve the level of your blood pressure and reach or maintain a healthy weight. General recommendations for healthful eating and cooking habits include:  Eating meals and snacks regularly. Avoid going long periods of time without eating to lose weight.  Eating a diet that consists mainly of plant-based foods, such as fruits, vegetables, nuts, legumes, and whole grains.  Using low-heat cooking methods, such as baking, instead of high-heat cooking methods, such as deep frying. Work with your dietitian to make sure you understand how to use the Nutrition Facts information on food labels. How can food affect me? Carbohydrates  Carbohydrates affect your blood glucose level more than any other type of food. Your dietitian will help you determine how many carbohydrates to eat at each meal and teach you how to count carbohydrates. Counting carbohydrates is important to keep your blood glucose at a healthy level, especially if you are using insulin or taking certain  medicines for diabetes mellitus. Alcohol  Alcohol can cause sudden decreases in blood glucose (hypoglycemia), especially if you use insulin or take certain medicines for diabetes mellitus. Hypoglycemia can be a life-threatening condition. Symptoms of hypoglycemia (sleepiness, dizziness, and disorientation) are similar to symptoms of having too much alcohol. If your health care provider has given you approval to drink alcohol, do so in moderation and use the following guidelines:  Women should not have more than one drink per day, and men should not have more than two drinks per day. One drink is equal to:  12 oz of beer.  5 oz of wine.  1 oz of hard liquor.  Do not drink on an empty stomach.  Keep yourself hydrated. Have water, diet soda, or unsweetened iced tea.  Regular soda, juice, and other mixers might contain a lot of carbohydrates and should be counted. What foods are not recommended? As you make food choices, it is important to remember that all foods are not the same. Some foods have fewer nutrients per serving than other foods, even though they might have the same number of calories or carbohydrates. It is difficult to get your body what it needs when you eat foods with fewer nutrients. Examples of foods that you should avoid that are high in calories and carbohydrates but low in nutrients include:  Trans fats (most processed foods list trans fats on the Nutrition Facts label).  Regular soda.  Juice.  Candy.  Sweets, such as cake, pie, doughnuts, and cookies.  Fried foods. What foods can I eat? Eat nutrient-rich foods, which will nourish your body and keep you healthy. The food you should eat also will depend on several factors, including:  The calories you need.  The medicines you take.  Your weight.  Your blood glucose level.  Your blood pressure level.  Your cholesterol level. You should eat a variety of foods, including:  Protein.  Lean cuts of  meat.  Proteins low in saturated fats, such as fish, egg whites, and beans. Avoid processed meats.  Fruits and vegetables.  Fruits and vegetables that may help control blood glucose levels, such as apples, mangoes, and yams.  Dairy products.  Choose fat-free or low-fat dairy products, such as milk, yogurt, and cheese.  Grains, bread, pasta, and rice.  Choose whole grain products, such as multigrain bread, whole oats, and brown rice. These foods may help control blood pressure.  Fats.  Foods containing healthful fats, such as nuts, avocado, olive oil, canola oil, and fish. Does everyone with diabetes mellitus have the same meal plan? Because every person with diabetes mellitus is different, there is not one meal plan that works for everyone. It is very important that you meet with a dietitian who will help you create a meal plan that is just right for you. This information is not intended to replace advice given to you by your health care provider. Make sure you discuss any questions you have with your health care provider. Document Released: 03/03/2005 Document Revised: 11/12/2015 Document Reviewed: 05/03/2013 Elsevier Interactive Patient Education  2017 [employer].  IF you received an x-ray today, you will receive an invoice from [facility]. Please contact [facility] at [phone] with questions or concerns regarding your invoice.   IF you received labwork today, you will receive an invoice from [place]. Please contact [facility] at [phone] with questions or concerns regarding your invoice.   Our billing staff will not be able to assist you with questions regarding bills from these companies.  You will be contacted with the lab results as soon as they are available. The fastest way to get your results is to activate your My Chart account. Instructions are located on the last page of this paperwork. If you have not heard from [place] regarding the results in 2  weeks, please contact this office.

## 2016-08-02 NOTE — Progress Notes (Signed)
Primary Care at [place], [place] [address] [phone]  Date:  08/02/2016   Name:  Randy Mcbride   DOB:  02/13/1956   MRN:  [id number]  PCP:  Lamar Blinks, MD    Chief Complaint: Annual Exam   History of Present Illness:  This is a very pleasant 61 y.o. male with PMH DM who is presenting for CPE. 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   DM - Metformin 562m qd. Not taking home blood sugar readings. Last diabetic check 13 months ago.  Glaucoma - Cosopt 2 drops L [facility] daily[facility]  Vit B12 def - Takes vit B12 5086m daily, gets this otc.    Complaints:  Left arm numbness. Occasionally. Goes away with application of his carpal tunnel band.  Immunizations: Needs Dentist: twice a year.   Eye: Last appt for MD eye exam was > 1 year ago.  Diet: chicken, rice, beans, eggs, pancakes, bacon, oatmeal, cheerios, yogurt, cake, pie, cookies, fritos, tacos, green beans, corn, turnips, fruit, biscuits, cornbread.  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 He won silver x 3. Bronze in shot put. He is always moving.  Fam hx: Father stroke age 724Mother diabetes. Sexual hx Married x 44 years.  Urinary hesitancy/frequency or nocturia: Problems with erectile dysfunction: Tobacco/alcohol/substance use: Non-smoker, Social drinker "every blue moon" - every few months, No drug use.   Colonoscopy: UTD. Due in 2024.  He is fasting today.   Review of Systems:  Review of Systems  Constitutional: Negative for activity change, appetite change, chills, diaphoresis and fatigue.  HENT: Negative for congestion, dental problem, sneezing and tinnitus.   Eyes: Negative for visual disturbance.  Respiratory: Negative for cough, chest tightness, shortness of breath and wheezing.   Cardiovascular: Negative for chest pain, palpitations and leg swelling.  Gastrointestinal: Negative for abdominal pain, blood in stool,  constipation, diarrhea, nausea and vomiting.  Endocrine: Negative for polydipsia, polyphagia and polyuria.  Genitourinary: Negative for decreased urine volume, difficulty urinating, discharge, hematuria, scrotal swelling and testicular pain.  Musculoskeletal: Negative for arthralgias, back pain, neck pain and neck stiffness.  Allergic/Immunologic: Negative for environmental allergies and food allergies.  Neurological: Negative for dizziness, syncope, weakness, light-headedness and headaches.  Psychiatric/Behavioral: Negative for sleep disturbance. The patient is not nervous/anxious.     Patient Active Problem List   Diagnosis Date Noted  . Glaucoma 07/13/2015  . Vitamin B12 deficiency 07/13/2015  . Type II or unspecified type diabetes mellitus with unspecified complication, uncontrolled 12/25/2013    Prior to Admission medications   Medication Sig Start Date End Date Taking? Authorizing Provider  dorzolamide-timolol (COSOPT) 22.3-6.8 MG/ML ophthalmic solution Place 1 drop into both eyes 2 (two) times daily.   Yes Historical Provider, MD  glucose blood (ONE TOUCH ULTRA TEST) test strip Use daily as needed 12/25/13  Yes JeGay Filleropland, MD  Lancets (ONETOUCH ULTRASOFT) lancets Use as instructed 12/25/13  Yes JeGay Filleropland, MD  metFORMIN (GLUCOPHAGE) 500 MG tablet Take one tablet by mouth once daily with breakfast  "NO MORE REFILLS WITHOUT OFFICE VISIT" 07/13/15  Yes NiBennett Scrape, PA-C  Multiple Vitamin (MULTIVITAMIN) tablet Take 1 tablet by mouth daily.   Yes Historical Provider, MD  [facility] USE  STRIP TO CHECK GLUCOSE TWICE DAILY 08/04/11  Yes SaMancel BalePA-C  vitamin B-12 (CYANOCOBALAMIN) 500 MCG tablet Take 500 mcg by mouth  daily.   Yes Historical Provider, MD    No Known Allergies  Past Surgical History:  Procedure Laterality Date  . EYE SURGERY    . KNEE ARTHROSCOPY      Social History  Substance Use Topics  . Smoking status: Never Smoker  .  Smokeless tobacco: Never Used  . Alcohol use 0.0 oz/week    Family History  Problem Relation Age of Onset  . Diabetes Mother   . Stroke Father   . Diabetes Maternal Grandmother   . Hypertension Paternal Grandfather     Medication list has been reviewed and updated.  Physical Examination:  Physical Exam  Constitutional: He is oriented to person, place, and time. He appears well-developed and well-nourished. No distress.  HENT:  Head: Normocephalic and atraumatic.  Right Ear: External ear normal.  Left Ear: External ear normal.  Nose: Nose normal.  Mouth/Throat: No oropharyngeal exudate.  Eyes: Conjunctivae and EOM are normal. Pupils are equal, round, and reactive to light.  Neck: Normal range of motion. No thyromegaly present.  Cardiovascular: Normal rate, regular rhythm, normal heart sounds and intact distal pulses.   No murmur heard. Pulmonary/Chest: Effort normal and breath sounds normal. No respiratory distress. He has no wheezes.  Abdominal: Soft. Bowel sounds are normal. He exhibits no distension and no mass. There is no tenderness.  Musculoskeletal: Normal range of motion. He exhibits no edema.  Lymphadenopathy:    He has no cervical adenopathy.  Neurological: He is alert and oriented to person, place, and time. He has normal reflexes.  Skin: Skin is warm and dry.  Psychiatric: He has a normal mood and affect. His behavior is normal. Judgment and thought content normal.  Vitals reviewed.   BP 122/76 (BP Location: Right Arm, Patient Position: Sitting, Cuff Size: Small)   Pulse 67   Temp 97.4 F (36.3 C) (Oral)   Resp 18   Ht 5' 8" (1.727 m)   Wt 178 lb 12.8 oz (81.1 kg)   SpO2 98%   BMI 27.19 kg/m   Health Maintenance reviewed - Foot exam performed. Negative.  Results for orders placed or performed in visit on 08/02/16  POCT glycosylated hemoglobin (Hb A1C)  Result Value Ref Range   Hemoglobin A1C 10.8     Assessment and Plan: 1. Annual physical  exam 2. Type 2 diabetes mellitus without complication, without long-term current use of insulin (HCC) 3. Elevated hemoglobin A1c measurement - POCT glycosylated hemoglobin (Hb A1C) - Microalbumin, urine - CMP14+EGFR - metFORMIN (GLUCOPHAGE) 500 MG tablet; Take one tablet by mouth twice a day.  Dispense: 90 tablet; Refill: 0 - HM Diabetes Foot Exam - Pt is a healthy 61 year old male. Feeling well today. A1C is 10.8. Will increase Metformin to 500 bid. RTC in 3 months for recheck. He understands and agrees. Information on food and diabetes printed off for pt.  Labs pending. Will contact with results.   4. Screening, lipid - Lipid panel  5. Encounter for hepatitis C screening test for low risk patient - Hepatitis C antibody  6. Vitamin B 12 deficiency - Vitamin B12  [name], PA-C  Primary Care at [place] 08/02/2016 10:23 AM

## 2016-08-03 LAB — CMP14+EGFR
ALT: 22 IU/L (ref 0–44)
AST: 20 IU/L (ref 0–40)
Albumin/Globulin Ratio: 1.5 (ref 1.2–2.2)
Albumin: 4.1 g/dL (ref 3.6–4.8)
Alkaline Phosphatase: 78 IU/L (ref 39–117)
BUN/Creatinine Ratio: 10 (ref 10–24)
BUN: 10 mg/dL (ref 8–27)
Bilirubin Total: 0.7 mg/dL (ref 0.0–1.2)
CO2: 25 mmol/L (ref 18–29)
Calcium: 9.3 mg/dL (ref 8.6–10.2)
Chloride: 98 mmol/L (ref 96–106)
Creatinine, Ser: 1 mg/dL (ref 0.76–1.27)
GFR calc Af Amer: 93 mL/min/{1.73_m2} (ref >59)
GFR calc non Af Amer: 80 mL/min/{1.73_m2} (ref >59)
Globulin, Total: 2.8 g/dL (ref 1.5–4.5)
Glucose: 260 mg/dL — ABNORMAL HIGH (ref 65–99)
Potassium: 4.4 mmol/L (ref 3.5–5.2)
Sodium: 137 mmol/L (ref 134–144)
Total Protein: 6.9 g/dL (ref 6.0–8.5)

## 2016-08-03 LAB — LIPID PANEL
Chol/HDL Ratio: 4.1 ratio units (ref 0.0–5.0)
Cholesterol, Total: 182 mg/dL (ref 100–199)
HDL: 44 mg/dL (ref >39)
LDL Calculated: 120 mg/dL — ABNORMAL HIGH (ref 0–99)
Triglycerides: 90 mg/dL (ref 0–149)
VLDL Cholesterol Cal: 18 mg/dL (ref 5–40)

## 2016-08-03 LAB — VITAMIN B12: Vitamin B-12: 1486 pg/mL — ABNORMAL HIGH (ref 232–1245)

## 2016-08-03 LAB — MICROALBUMIN, URINE: Microalbumin, Urine: 9.5 ug/mL

## 2016-08-03 LAB — HEPATITIS C ANTIBODY: Hep C Virus Ab: <0.1 s/co ratio (ref 0.0–0.9)

## 2016-08-05 ENCOUNTER — Telehealth: Payer: Self-pay

## 2016-08-05 DIAGNOSIS — E118 Type 2 diabetes mellitus with unspecified complications: Secondary | ICD-10-CM

## 2016-08-05 MED ORDER — GLUCOSE BLOOD VI STRP: ORAL_STRIP | 11 refills | Status: DC

## 2016-08-05 NOTE — Telephone Encounter (Signed)
done

## 2016-08-05 NOTE — Telephone Encounter (Signed)
Pt needs a refill on his onetouch blood glucouse strips.  [phone]

## 2016-08-05 NOTE — Telephone Encounter (Signed)
Dr. Clelia CroftShaw can you send these [facility] strips into [facility] [address] since HaroldMcvey is out of office.

## 2016-08-05 NOTE — Telephone Encounter (Signed)
Left a vm for patient to callback and let us know which pharmacy he wants his One Touch Verio Flex strips sent to.

## 2016-08-15 ENCOUNTER — Other Ambulatory Visit: Payer: Self-pay

## 2016-08-22 ENCOUNTER — Encounter: Payer: Self-pay | Admitting: Physician Assistant

## 2016-08-22 NOTE — Progress Notes (Signed)
Last OV increased his Metformin to 500mg  bid - was previously on qd. F/u in 2 months.

## 2016-09-19 DIAGNOSIS — H401133 Primary open-angle glaucoma, bilateral, severe stage: Secondary | ICD-10-CM | POA: Diagnosis not present

## 2016-10-31 ENCOUNTER — Ambulatory Visit: Payer: Commercial Managed Care - HMO | Admitting: Physician Assistant

## 2016-11-04 ENCOUNTER — Encounter: Payer: Self-pay | Admitting: Physician Assistant

## 2016-11-04 ENCOUNTER — Ambulatory Visit (INDEPENDENT_AMBULATORY_CARE_PROVIDER_SITE_OTHER): Payer: Commercial Managed Care - HMO | Admitting: Physician Assistant

## 2016-11-04 VITALS — BP 122/79 | HR 78 | Temp 98.5°F | Resp 17 | Ht 68.5 in | Wt 180.0 lb

## 2016-11-04 DIAGNOSIS — Z79899 Other long term (current) drug therapy: Secondary | ICD-10-CM

## 2016-11-04 DIAGNOSIS — E118 Type 2 diabetes mellitus with unspecified complications: Secondary | ICD-10-CM | POA: Diagnosis not present

## 2016-11-04 LAB — POCT GLYCOSYLATED HEMOGLOBIN (HGB A1C): Hemoglobin A1C: 7

## 2016-11-04 MED ORDER — METFORMIN HCL ER (MOD) 1000 MG PO TB24: 1000.0000 mg | ORAL_TABLET | Freq: Every day | ORAL | 3 refills | Status: DC

## 2016-11-04 NOTE — Progress Notes (Signed)
   Coletta MemosRonald D Zacarias  MRN: [id number] DOB: [birth year]  PCP: Patient, No Pcp Per  Subjective:  Pt is a 61 year old male h/o glaucoma who presents to clinic for diabetes check. He was here three months ago with an A1C of 10.8%. Increased Metformin from 500mg  qd to 500 mg bid.  He has cut back a lot on sodas, energy drinks, fried food. He drinks coffee black now, rather than putting sugar in it.  He is eating a lot of salad with grilled chicken.  Exercise - [place] [employer] [facility]. He goes there 2 x/week. 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  Complains of increased amount of bowel movements. Has 6/day. Usually well formed. Endorses a few episodes of diarrhea.  Denies n/v, abdominal pain, n/t of extremities.  He still have >1 week of medications.   Review of Systems  Cardiovascular: Negative for chest pain and palpitations.  Gastrointestinal: Negative for abdominal pain, constipation and diarrhea.  Neurological: Negative for numbness.    Patient Active Problem List   Diagnosis Date Noted  . Glaucoma 07/13/2015  . Vitamin B12 deficiency 07/13/2015  . Type II or unspecified type diabetes mellitus with unspecified complication, uncontrolled 12/25/2013    Current Outpatient Prescriptions on File Prior to Visit  Medication Sig Dispense Refill  . dorzolamide-timolol (COSOPT) 22.3-6.8 MG/ML ophthalmic solution Place 1 drop into both eyes 2 (two) times daily.    Marland Kitchen. glucose blood (ONE TOUCH ULTRA TEST) test strip Use tid to qid as directed by physician 100 each 11  . metFORMIN (GLUCOPHAGE) 500 MG tablet Take one tablet by mouth twice a day. 90 tablet 0  . Multiple Vitamin (MULTIVITAMIN) tablet Take 1 tablet by mouth daily.     No current facility-administered medications on file prior to visit.     No Known Allergies   Objective:  BP 122/79   Pulse 78   Temp 98.5 F (36.9 C) (Oral)   Resp 17   Ht 5' 8.5" (1.74 m)   Wt 180 lb (81.6 kg)   SpO2 98%   BMI  26.97 kg/m   Physical Exam  Constitutional: He is oriented to person, place, and time and well-developed, well-nourished, and in no distress. No distress.  Cardiovascular: Normal rate, regular rhythm and normal heart sounds.   Abdominal: Soft. There is no tenderness.  Neurological: He is alert and oriented to person, place, and time. GCS score is 15.  Skin: Skin is warm and dry.  Psychiatric: Mood, memory, affect and judgment normal.  Vitals reviewed.   Lab Results  Component Value Date   HGBA1C 10.8 08/02/2016   Results for orders placed or performed in visit on 11/04/16  POCT glycosylated hemoglobin (Hb A1C)  Result Value Ref Range   Hemoglobin A1C 7.0     Assessment and Plan :  1. Type 2 diabetes mellitus with complication, without long-term current use of insulin (HCC) 2. Encounter for medication management - POCT glycosylated hemoglobin (Hb A1C) - metFORMIN (GLUMETZA) 1000 MG (MOD) 24 hr tablet; Take 1 tablet (1,000 mg total) by mouth daily with breakfast.  Dispense: 90 tablet; Refill: 3 - Pt has improved A1C greatly in 3 months: 10.8 >> 7.0. Con't with healthier diet and regular exercise Will switch to Metformin XR due to GI side effects. RTC in 6 months for recheck. He agrees with plan.   Marco CollieWhitney Edris Friedt, PA-C  [facility] 11/04/2016 12:13 PM

## 2016-11-04 NOTE — Patient Instructions (Addendum)
I will contact you with results of today's A1C level. We will switch to Metformin extended release tablets.   Thank you for coming in today. I hope you feel we met your needs.  Feel free to call [facility] if you have any questions or further requests.  Please consider signing up for MyChart if you do not already have it, as this is a great way to communicate with me.  Best,  Whitney McVey, PA-C   Carbohydrate Counting for Diabetes Mellitus, Adult Carbohydrate counting is a method for keeping track of how many carbohydrates you eat. Eating carbohydrates naturally increases the amount of sugar (glucose) in the blood. Counting how many carbohydrates you eat helps keep your blood glucose within normal limits, which helps you manage your diabetes (diabetes mellitus). It is important to know how many carbohydrates you can safely have in each meal. This is different for every person. A diet and nutrition specialist (registered dietitian) can help you make a meal plan and calculate how many carbohydrates you should have at each meal and snack. Carbohydrates are found in the following foods:  Grains, such as breads and cereals.  Dried beans and soy products.  Starchy vegetables, such as potatoes, peas, and corn.  Fruit and fruit juices.  Milk and yogurt.  Sweets and snack foods, such as cake, cookies, candy, chips, and soft drinks. How do I count carbohydrates? There are two ways to count carbohydrates in food. You can use either of the methods or a combination of both. Reading "Nutrition Facts" on packaged food  The "Nutrition Facts" list is included on the labels of almost all packaged foods and beverages in the [place] It includes:  The serving size.  Information about nutrients in each serving, including the grams (g) of carbohydrate per serving. To use the "Nutrition Facts":  Decide how many servings you will have.  Multiply the number of servings by the number of carbohydrates per  serving.  The resulting number is the total amount of carbohydrates that you will be having. Learning standard serving sizes of other foods  When you eat foods containing carbohydrates that are not packaged or do not include "Nutrition Facts" on the label, you need to measure the servings in order to count the amount of carbohydrates:  Measure the foods that you will eat with a food scale or measuring cup, if needed.  Decide how many standard-size servings you will eat.  Multiply the number of servings by 15. Most carbohydrate-rich foods have about 15 g of carbohydrates per serving.  For example, if you eat 8 oz (170 g) of strawberries, you will have eaten 2 servings and 30 g of carbohydrates (2 servings x 15 g = 30 g).  For foods that have more than one food mixed, such as soups and casseroles, you must count the carbohydrates in each food that is included. The following list contains standard serving sizes of common carbohydrate-rich foods. Each of these servings has about 15 g of carbohydrates:   hamburger bun or  English muffin.   oz (15 mL) syrup.   oz (14 g) jelly.  1 slice of bread.  1 six-inch tortilla.  3 oz (85 g) cooked rice or pasta.  4 oz (113 g) cooked dried beans.  4 oz (113 g) starchy vegetable, such as peas, corn, or potatoes.  4 oz (113 g) hot cereal.  4 oz (113 g) mashed potatoes or  of a large baked potato.  4 oz (113 g) canned or  frozen fruit.  4 oz (120 mL) fruit juice.  4-6 crackers.  6 chicken nuggets.  6 oz (170 g) unsweetened dry cereal.  6 oz (170 g) plain fat-free yogurt or yogurt sweetened with artificial sweeteners.  8 oz (240 mL) milk.  8 oz (170 g) fresh fruit or one small piece of fruit.  24 oz (680 g) popped popcorn. Example of carbohydrate counting Sample meal   3 oz (85 g) chicken breast.  6 oz (170 g) brown rice.  4 oz (113 g) corn.  8 oz (240 mL) milk.  8 oz (170 g) strawberries with sugar-free whipped  topping. Carbohydrate calculation  1. Identify the foods that contain carbohydrates:  Rice.  Corn.  Milk.  Strawberries. 2. Calculate how many servings you have of each food:  2 servings rice.  1 serving corn.  1 serving milk.  1 serving strawberries. 3. Multiply each number of servings by 15 g:  2 servings rice x 15 g = 30 g.  1 serving corn x 15 g = 15 g.  1 serving milk x 15 g = 15 g.  1 serving strawberries x 15 g = 15 g. 4. Add together all of the amounts to find the total grams of carbohydrates eaten:  30 g + 15 g + 15 g + 15 g = 75 g of carbohydrates total. This information is not intended to replace advice given to you by your health care provider. Make sure you discuss any questions you have with your health care provider. Document Released: 06/06/2005 Document Revised: 12/25/2015 Document Reviewed: 11/18/2015 Elsevier Interactive Patient Education  2017 [employer].   IF you received an x-ray today, you will receive an invoice from [facility]. Please contact [facility] at [phone] with questions or concerns regarding your invoice.   IF you received labwork today, you will receive an invoice from [place]. Please contact [facility] at [phone] with questions or concerns regarding your invoice.   Our billing staff will not be able to assist you with questions regarding bills from these companies.  You will be contacted with the lab results as soon as they are available. The fastest way to get your results is to activate your My Chart account. Instructions are located on the last page of this paperwork. If you have not heard from [place] regarding the results in 2 weeks, please contact this office.

## 2016-11-21 DIAGNOSIS — H401133 Primary open-angle glaucoma, bilateral, severe stage: Secondary | ICD-10-CM | POA: Diagnosis not present

## 2016-12-05 ENCOUNTER — Other Ambulatory Visit: Payer: Self-pay | Admitting: Physician Assistant

## 2016-12-05 DIAGNOSIS — E118 Type 2 diabetes mellitus with unspecified complications: Secondary | ICD-10-CM

## 2016-12-05 MED ORDER — METFORMIN HCL ER (MOD) 1000 MG PO TB24: 1000.0000 mg | ORAL_TABLET | Freq: Every day | ORAL | 3 refills | Status: DC

## 2016-12-27 DIAGNOSIS — H401133 Primary open-angle glaucoma, bilateral, severe stage: Secondary | ICD-10-CM | POA: Diagnosis not present

## 2017-05-17 ENCOUNTER — Telehealth: Payer: Self-pay | Admitting: Physician Assistant

## 2017-05-17 NOTE — Telephone Encounter (Signed)
Copied from CRM [phone]. Topic: Quick Communication - See Telephone Encounter >> May 17, 2017 12:07 PM Everardo PacificMoton, Ivelise Castillo, [place] wrote: CRM for notification. See Telephone encounter for: [facility] 1000 mg metformin is not covered by insurance and would like to know if the script can be changed to 500mg  of metformin 2 pills everyday. If someone could give them a call back at [phone]  05/17/17.

## 2017-05-17 NOTE — Telephone Encounter (Signed)
See attached

## 2017-05-18 ENCOUNTER — Other Ambulatory Visit: Payer: Self-pay

## 2017-05-18 MED ORDER — METFORMIN HCL 500 MG PO TABS: 1000.0000 mg | ORAL_TABLET | Freq: Every day | ORAL | 2 refills | Status: DC

## 2017-05-18 NOTE — Telephone Encounter (Signed)
Spoke with pharmacist.  [facility] [place]

## 2017-07-03 ENCOUNTER — Telehealth: Payer: Self-pay | Admitting: Family Medicine

## 2017-07-03 NOTE — Telephone Encounter (Signed)
Copied from CRM [id number]. Topic: Quick Communication - See Telephone Encounter >> Jul 03, 2017  2:00 PM Arlyss Gandyichardson, Horacio Werth N, NT wrote: CRM for notification. See Telephone encounter for: [facility] and states that a rx for glucose test strips were sent over for this pt, but no rx for a glucose meter. This rx was sent in February 2018 and pt is now trying to pick up the test strips. [facility] to see if a rx for the meter can be sent.   07/03/17.

## 2017-07-06 ENCOUNTER — Other Ambulatory Visit: Payer: Self-pay

## 2017-07-06 NOTE — Telephone Encounter (Signed)
Please order Glucose meter for one touch verio test strips.

## 2017-07-07 ENCOUNTER — Other Ambulatory Visit: Payer: Self-pay | Admitting: Physician Assistant

## 2017-07-07 DIAGNOSIS — E118 Type 2 diabetes mellitus with unspecified complications: Secondary | ICD-10-CM

## 2017-07-07 MED ORDER — GNP EASY TOUCH GLUCOSE METER DEVI: 1.0000 | Freq: Two times a day (BID) | 0 refills | Status: DC

## 2017-07-07 NOTE — Telephone Encounter (Signed)
done

## 2017-08-23 ENCOUNTER — Encounter: Payer: Self-pay | Admitting: Physician Assistant

## 2017-08-23 ENCOUNTER — Other Ambulatory Visit: Payer: Self-pay

## 2017-08-23 ENCOUNTER — Ambulatory Visit: Payer: 59 | Admitting: Physician Assistant

## 2017-08-23 VITALS — BP 124/72 | HR 105 | Temp 100.3°F | Resp 16 | Ht 68.0 in | Wt 181.6 lb

## 2017-08-23 DIAGNOSIS — R05 Cough: Secondary | ICD-10-CM

## 2017-08-23 DIAGNOSIS — R059 Cough, unspecified: Secondary | ICD-10-CM

## 2017-08-23 DIAGNOSIS — R6889 Other general symptoms and signs: Secondary | ICD-10-CM

## 2017-08-23 DIAGNOSIS — J209 Acute bronchitis, unspecified: Secondary | ICD-10-CM

## 2017-08-23 DIAGNOSIS — J101 Influenza due to other identified influenza virus with other respiratory manifestations: Secondary | ICD-10-CM

## 2017-08-23 LAB — POC INFLUENZA A&B (BINAX/QUICKVUE)
Influenza A, POC: POSITIVE — AB
Influenza B, POC: NEGATIVE

## 2017-08-23 MED ORDER — AZITHROMYCIN 250 MG PO TABS: ORAL_TABLET | ORAL | 0 refills | Status: DC

## 2017-08-23 MED ORDER — HYDROCODONE-HOMATROPINE 5-1.5 MG/5ML PO SYRP: 5.0000 mL | ORAL_SOLUTION | Freq: Three times a day (TID) | ORAL | 0 refills | Status: DC | PRN

## 2017-08-23 MED ORDER — BENZONATATE 100 MG PO CAPS: 100.0000 mg | ORAL_CAPSULE | Freq: Three times a day (TID) | ORAL | 0 refills | Status: DC | PRN

## 2017-08-23 MED ORDER — OSELTAMIVIR PHOSPHATE 75 MG PO CAPS: 75.0000 mg | ORAL_CAPSULE | Freq: Two times a day (BID) | ORAL | 0 refills | Status: DC

## 2017-08-23 NOTE — Progress Notes (Signed)
Randy Mcbride  MRN: [id number] DOB: 1955/07/30  PCP: Sebastian Ache, PA-C  Subjective:  Pt is a pleasant 62 year old male PMH DM who presents to clinic for cold symptoms x 10 days.  C/o cough x 10 days and nasal congestion x 7 days. Chills, night sweats and fever last week.  He has taken Robitussen, theraflu and mucinex - not helping much. Symptoms worsened 2 days ago with fatigue.  Recent sick contacts: grandkids and wife.   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   Review of Systems  Constitutional: Positive for chills, fatigue and fever.  HENT: Positive for congestion and rhinorrhea. Negative for postnasal drip, sinus pressure and sinus pain.   Respiratory: Positive for cough. Negative for shortness of breath and wheezing.   Cardiovascular: Negative for chest pain and palpitations.  Psychiatric/Behavioral: Positive for sleep disturbance.    Patient Active Problem List   Diagnosis Date Noted  . Glaucoma 07/13/2015  . Vitamin B12 deficiency 07/13/2015  . Diabetes (HCC) 12/25/2013    Current Outpatient Medications on File Prior to Visit  Medication Sig Dispense Refill  . brimonidine (ALPHAGAN) 0.15 % ophthalmic solution 1 drop 3 (three) times daily.    Marland Kitchen dextromethorphan-guaiFENesin (MUCINEX DM) 30-600 MG 12hr tablet Take 1 tablet by mouth 2 (two) times daily.    . dorzolamide-timolol (COSOPT) 22.3-6.8 MG/ML ophthalmic solution Place 1 drop into both eyes 2 (two) times daily.    . metFORMIN (GLUCOPHAGE) 500 MG tablet Take 2 tablets (1,000 mg total) by mouth daily with breakfast. 180 tablet 2  . Multiple Vitamin (MULTIVITAMIN) tablet Take 1 tablet by mouth daily.    Marland Kitchen Phenylephrine-Pheniramine-DM (THERAFLU COLD & COUGH PO) Take by mouth.    . Blood Glucose Monitoring Suppl (GNP EASY TOUCH GLUCOSE METER) DEVI 1 Device by Does not apply route 2 (two) times daily. (Patient  not taking: Reported on 08/23/2017) 1 Device 0  . glucose blood (ONE TOUCH ULTRA TEST) test strip Use tid to qid as directed by physician (Patient not taking: Reported on 08/23/2017) 100 each 11   No current facility-administered medications on file prior to visit.     No Known Allergies   Objective:  BP 124/72   Pulse (!) 105   Temp 100.3 F (37.9 C) (Oral)   Resp 16   Ht 5\' 8"  (1.727 m)   Wt 181 lb 9.6 oz (82.4 kg)   SpO2 97%   BMI 27.61 kg/m   Physical Exam  Constitutional: He is oriented to person, place, and time and well-developed, well-nourished, and in no distress. No distress.  HENT:  Right Ear: Tympanic membrane normal.  Left Ear: Tympanic membrane normal.  Mouth/Throat: Oropharynx is clear and moist and mucous membranes are normal.  Neck: Normal range of motion. Neck supple.  Cardiovascular: Normal rate, regular rhythm and normal heart sounds.  Pulmonary/Chest: Effort normal and breath sounds normal. He has no wheezes. He has no rales.  Neurological: He is alert and oriented to person, place, and time. GCS score is 15.  Skin: Skin is warm and dry.  Psychiatric: Mood, memory, affect and judgment normal.  Vitals reviewed.  Results for orders placed or performed in visit on 08/23/17  POC Influenza A&B (Binax test)  Result Value Ref Range   Influenza A, POC Positive (A) Negative   Influenza B, POC Negative Negative  Assessment and Plan :  1. Influenza A 2. Flu-like symptoms - POC Influenza A&B (Binax test)- oseltamivir (TAMIFLU) 75mg  capsule  - Pt presents with cough x 10 days. Symptoms worsened 2 days ago with fatigue. Positive flu A. Unclear on time frame for when flu started. Symptoms worsened 2 days ago, so plan to treat with Tamiflu and cover with azithromycin. RTC in 3-5 days if no improvement.  3. Acute bronchitis, unspecified organism 4. Cough - azithromycin (ZITHROMAX) 250 MG tablet; Take 2 tabs PO x 1 dose, then 1 tab PO QD x 4 days  Dispense: 6 tablet;  Refill: 0 - benzonatate (TESSALON) 100 MG capsule; Take 1-2 capsules (100-200 mg total) by mouth 3 (three) times daily as needed for cough.  Dispense: 40 capsule; Refill: 0 - HYDROcodone-homatropine (HYCODAN) 5-1.5 MG/5ML syrup; Take 5 mLs by mouth every 8 (eight) hours as needed for cough.  Dispense: 120 mL; Refill: 0   Whitney Leonardo Plaia, PA-C  Primary [facility] at [facility] 08/23/2017 9:25 AM

## 2017-08-23 NOTE — Patient Instructions (Addendum)
Azithromycin is an antibiotic - take the entire course even if you start to feel better.  hydocan is a cough syrup with hycodan - this will make you drowsy. Take this at night.  Tessalon is for cough during the day. Take this as directed.  Come back if you are not better in 5-7 days. You may have a lingering cough for the next few weeks.   Stay well hydrated. Get lost of rest. Wash your hands often.   -Foods that can help speed recovery: honey, garlic, chicken soup, elderberries, green tea.  -Supplements that can help speed recovery: vitamin C, zinc, elderberry extract, quercetin, ginseng, selenium -Supplement with prebiotics and probiotics.   Advil or ibuprofen for pain. Do not take Aspirin.  Drink enough water and fluids to keep your urine clear or pale yellow.  For sore throat: ? Gargle with 8 oz of salt water ( tsp of salt per 1 qt of water) as often as every 1-2 hours to soothe your throat.  Gargle liquid benadryl.  Cepacol throat lozenges (if you are not at risk for choking).  For sore throat try using a honey-based tea. Use 3 teaspoons of honey with juice squeezed from half lemon. Place shaved pieces of ginger into 1/2-1 cup of water and warm over stove top. Then mix the ingredients and repeat every 4 hours as needed.  Cough Syrup Recipe: Sweet Lemon & Honey Thyme  Ingredients a handful of fresh thyme sprigs   1 pint of water (2 cups)  1/2 cup honey (raw is best, but regular will do)  1/2 lemon chopped Instructions 1. Place the lemon in the pint jar and cover with the honey. The honey will macerate the lemons and draw out liquids which taste so delicious! 2. Meanwhile, toss the thyme leaves into a saucepan and cover them with the water. 3. Bring the water to a gentle simmer and reduce it to half, about a cup of tea. 4. When the tea is reduced and cooled a bit, strain the sprigs & leaves, add it into the pint jar and stir it well. 5. Give it a shake and use a spoonful as  needed. 6. Store your homemade cough syrup in the refrigerator for about a month.  What causes a cough? In adults, common causes of a cough include: ?An infection of the airways or lungs (such as the common cold) ?Postnasal drip - Postnasal drip is when mucus from the nose drips down or flows along the back of the throat. Postnasal drip can happen when people have: .A cold .Allergies .A sinus infection - The sinuses are hollow areas in the bones of the face that open into the nose. ?Lung conditions, like asthma and chronic obstructive pulmonary disease (COPD) - Both of these conditions can make it hard to breathe. COPD is usually caused by smoking. ?Acid reflux - Acid reflux is when the acid that is normally in your stomach backs up into your esophagus (the tube that carries food from your mouth to your stomach). ?A side effect from blood pressure medicines called "ACE inhibitors" ?Smoking cigarettes  Is there anything I can do on my own to get rid of my cough? Yes. To help get rid of your cough, you can: ?Use a humidifier in your bedroom ?Use an over-the-counter cough medicine, or suck on cough drops or hard candy ?Stop smoking, if you smoke ?If you have allergies, avoid the things you are allergic to (like pollen, dust, animals, or mold) If you  have acid reflux, your doctor or nurse will tell you which lifestyle changes can help reduce symptoms.     IF you received an x-ray today, you will receive an invoice [facility]. Please contact [facility] at [phone] with questions or concerns regarding your invoice.   IF you received labwork today, you will receive an invoice from [facility]. Please contact [facility] at [phone] with questions or concerns regarding your invoice.   Our billing staff will not be able to assist you with questions regarding bills from these companies.  You will be contacted with the lab results as soon as they are available. The  fastest way to get your results is to activate your My Chart account. Instructions are located on the last page of this paperwork. If you have not heard from us regarding the results in 2 weeks, please contact this office.

## 2017-10-13 ENCOUNTER — Other Ambulatory Visit: Payer: Self-pay | Admitting: Family Medicine

## 2017-10-13 DIAGNOSIS — E118 Type 2 diabetes mellitus with unspecified complications: Secondary | ICD-10-CM

## 2017-10-27 ENCOUNTER — Telehealth: Payer: Self-pay | Admitting: Physician Assistant

## 2017-10-27 MED ORDER — METFORMIN HCL 500 MG PO TABS: 1000.0000 mg | ORAL_TABLET | Freq: Every day | ORAL | 0 refills | Status: DC

## 2017-10-27 NOTE — Telephone Encounter (Signed)
Copied from CRM [id number]. Topic: Quick Communication - Rx Refill/Question >> Oct 27, 2017  9:59 AM Floria Raveling A wrote: Medication: metFORMIN (GLUCOPHAGE) 500 MG tablet [[id number]]  Has the patient contacted their pharmacy?  No  (Agent: If no, request that the patient contact the pharmacy for the refill.) Preferred Pharmacy (with phone number [facility] - [facility]  Agent: Please be advised that RX refills may take up to 3 business days. We ask that you follow-up with your pharmacy.

## 2017-11-01 ENCOUNTER — Encounter: Payer: Self-pay | Admitting: Physician Assistant

## 2017-11-01 ENCOUNTER — Other Ambulatory Visit: Payer: Self-pay

## 2017-11-01 ENCOUNTER — Ambulatory Visit (INDEPENDENT_AMBULATORY_CARE_PROVIDER_SITE_OTHER): Payer: 59 | Admitting: Physician Assistant

## 2017-11-01 VITALS — BP 138/70 | HR 62 | Temp 98.0°F | Resp 16 | Ht 67.0 in | Wt 179.8 lb

## 2017-11-01 DIAGNOSIS — E538 Deficiency of other specified B group vitamins: Secondary | ICD-10-CM | POA: Diagnosis not present

## 2017-11-01 DIAGNOSIS — E118 Type 2 diabetes mellitus with unspecified complications: Secondary | ICD-10-CM | POA: Diagnosis not present

## 2017-11-01 DIAGNOSIS — Z0000 Encounter for general adult medical examination without abnormal findings: Secondary | ICD-10-CM

## 2017-11-01 LAB — POCT URINALYSIS DIP (MANUAL ENTRY)
Bilirubin, UA: NEGATIVE
Blood, UA: NEGATIVE
Glucose, UA: NEGATIVE mg/dL
Ketones, POC UA: NEGATIVE mg/dL
Leukocytes, UA: NEGATIVE
Nitrite, UA: NEGATIVE
Protein Ur, POC: NEGATIVE mg/dL
Spec Grav, UA: 1.015 (ref 1.010–1.025)
Urobilinogen, UA: 0.2 E.U./dL
pH, UA: 6.5 (ref 5.0–8.0)

## 2017-11-01 NOTE — Patient Instructions (Addendum)
It was a pleasure seeing you today! You may also consider apple cider vinegar for joints.  Continue cutting back on your snacks. I'd like to see your daily blood sugars around 140.    Thank you for coming in today. I hope you feel we met your needs.  Feel free to call PCP if you have any questions or further requests.  Please consider signing up for MyChart if you do not already have it, as this is a great way to communicate with me.  Best,  [name], PA-C   Health Maintenance, Male A healthy lifestyle and preventive care is important for your health and wellness. Ask your health care provider about what schedule of regular examinations is right for you. What should I know about weight and diet? Eat a Healthy Diet  Eat plenty of vegetables, fruits, whole grains, low-fat dairy products, and lean protein.  Do not eat a lot of foods high in solid fats, added sugars, or salt.  Maintain a Healthy Weight Regular exercise can help you achieve or maintain a healthy weight. You should:  Do at least 150 minutes of exercise each week. The exercise should increase your heart rate and make you sweat (moderate-intensity exercise).  Do strength-training exercises at least twice a week.  Watch Your Levels of Cholesterol and Blood Lipids  Have your blood tested for lipids and cholesterol every 5 years starting at 64 years of age. If you are at high risk for heart disease, you should start having your blood tested when you are 64 years old. You may need to have your cholesterol levels checked more often if: ? Your lipid or cholesterol levels are high. ? You are older than 64 years of age. ? You are at high risk for heart disease.  What should I know about cancer screening? Many types of cancers can be detected early and may often be prevented. Lung Cancer  You should be screened every year for lung cancer if: ? You are a current smoker who has smoked for at least 30 years. ? You are a  former smoker who has quit within the past 15 years.  Talk to your health care provider about your screening options, when you should start screening, and how often you should be screened.  Colorectal Cancer  Routine colorectal cancer screening usually begins at 64 years of age and should be repeated every 5-10 years until you are 64 years old. You may need to be screened more often if early forms of precancerous polyps or small growths are found. Your health care provider may recommend screening at an earlier age if you have risk factors for colon cancer.  Your health care provider may recommend using home test kits to check for hidden blood in the stool.  A small camera at the end of a tube can be used to examine your colon (sigmoidoscopy or colonoscopy). This checks for the earliest forms of colorectal cancer.  Prostate and Testicular Cancer  Depending on your age and overall health, your health care provider may do certain tests to screen for prostate and testicular cancer.  Talk to your health care provider about any symptoms or concerns you have about testicular or prostate cancer.  Skin Cancer  Check your skin from head to toe regularly.  Tell your health care provider about any new moles or changes in moles, especially if: ? There is a change in a mole's size, shape, or color. ? You have a mole that is  larger than a pencil eraser.  Always use sunscreen. Apply sunscreen liberally and repeat throughout the day.  Protect yourself by wearing long sleeves, pants, a wide-brimmed hat, and sunglasses when outside.  What should I know about heart disease, diabetes, and high blood pressure?  If you are 66-42 years of age, have your blood pressure checked every 3-5 years. If you are 81 years of age or older, have your blood pressure checked every year. You should have your blood pressure measured twice-once when you are at a hospital or clinic, and once when you are not at a hospital or  clinic. Record the average of the two measurements. To check your blood pressure when you are not at a hospital or clinic, you can use: ? An automated blood pressure machine at a pharmacy. ? A home blood pressure monitor.  Talk to your health care provider about your target blood pressure.  If you are between 12-59 years old, ask your health care provider if you should take aspirin to prevent heart disease.  Have regular diabetes screenings by checking your fasting blood sugar level. ? If you are at a normal weight and have a low risk for diabetes, have this test once every three years after the age of 38. ? If you are overweight and have a high risk for diabetes, consider being tested at a younger age or more often.  A one-time screening for abdominal aortic aneurysm (AAA) by ultrasound is recommended for men aged 2-75 years who are current or former smokers. What should I know about preventing infection? Hepatitis B If you have a higher risk for hepatitis B, you should be screened for this virus. Talk with your health care provider to find out if you are at risk for hepatitis B infection. Hepatitis C Blood testing is recommended for:  Everyone born from [birth year] through 1967.  Anyone with known risk factors for hepatitis C.  Sexually Transmitted Diseases (STDs)  You should be screened each year for STDs including gonorrhea and chlamydia if: ? You are sexually active and are younger than 64 years of age. ? You are older than 64 years of age and your health care provider tells you that you are at risk for this type of infection. ? Your sexual activity has changed since you were last screened and you are at an increased risk for chlamydia or gonorrhea. Ask your health care provider if you are at risk.  Talk with your health care provider about whether you are at high risk of being infected with HIV. Your health care provider may recommend a prescription medicine to help prevent HIV  infection.  What else can I do?  Schedule regular health, dental, and eye exams.  Stay current with your vaccines (immunizations).  Do not use any tobacco products, such as cigarettes, chewing tobacco, and e-cigarettes. If you need help quitting, ask your health care provider.  Limit alcohol intake to no more than 2 drinks per day. One drink equals 12 ounces of beer, 5 ounces of wine, or 1 ounces of hard liquor.  Do not use street drugs.  Do not share needles.  Ask your health care provider for help if you need support or information about quitting drugs.  Tell your health care provider if you often feel depressed.  Tell your health care provider if you have ever been abused or do not feel safe at home. This information is not intended to replace advice given to you by your health care  provider. Make sure you discuss any questions you have with your health care provider. Document Released: 12/03/2007 Document Revised: 02/03/2016 Document Reviewed: 03/10/2015 Elsevier Interactive Patient Education  2018 [employer].     IF you received an x-ray today, you will receive an invoice from [facility]. Please contact [facility] at [phone] with questions or concerns regarding your invoice.   IF you received labwork today, you will receive an invoice from [place]. Please contact [facility] at [phone] with questions or concerns regarding your invoice.   Our billing staff will not be able to assist you with questions regarding bills from these companies.  You will be contacted with the lab results as soon as they are available. The fastest way to get your results is to activate your My Chart account. Instructions are located on the last page of this paperwork. If you have not heard from [place] regarding the results in 2 weeks, please contact this office.

## 2017-11-01 NOTE — Progress Notes (Signed)
Primary Care at [facility] [address] [place] [address] [phone]  Date:  11/01/2017   Name:  Randy Mcbride   DOB:  11/02/1955   MRN:  [id number]  PCP:  Sebastian Ache, PA-C    Chief Complaint: Annual Exam and left shoulder (painful day to day x 3 wks )   History of Present Illness:  This is a 62 y.o. male with PMH DM, glaucoma and vitamin B12 deficiency who is presenting for CPE. Last annual exam 07/2016.  Recently won gold at the senior games. He will most likely be invited to the state competition.  He is fasting today.   DM - Metformin  qd. home blood sugar readings 119, 170, 220, 140. Last A1C check 07/07/2017.  Glaucoma - Cosopt 2 drops L eye daily. He fired to [facility]. He has ophthalmologist next month.    Vit B12 def - no longer taking vit B12 daily bc last B12 labs were fine.    Complaints:  none Immunizations: UTD  Dentist: regularly. Getting partials bottom teeth.  Eye: 20/25. Wears glasses.  Diet: he is eating less snacks. Doesn't eat healthy.  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 He won gold.   Fam hx: family history includes Diabetes in his maternal grandmother and mother; Hypertension in his mother and paternal grandfather; Stroke in his father. Sexual hx: married x 45 years Urinary hesitancy/frequency or nocturia: none. Wakes up 1-2 x/night to urinate.  Problems with erectile dysfunction: none Tobacco/alcohol/substance use: none  Colonoscopy: UTD. Due in 2024.    Review of Systems:  Review of Systems  Constitutional: Negative for activity change, appetite change and fatigue.  HENT: Negative for congestion, dental problem, sneezing and tinnitus.   Eyes: Negative for visual disturbance.  Respiratory: Negative for cough, chest tightness, shortness of breath and wheezing.   Cardiovascular: Negative for chest pain, palpitations and leg swelling.  Gastrointestinal: Negative for  abdominal pain, blood in stool, constipation, diarrhea, nausea and vomiting.  Endocrine: Negative for polydipsia, polyphagia and polyuria.  Genitourinary: Negative for decreased urine volume, difficulty urinating, discharge, hematuria, scrotal swelling and testicular pain.  Musculoskeletal: Negative for arthralgias, back pain and neck stiffness.  Allergic/Immunologic: Negative for environmental allergies and food allergies.  Neurological: Negative for dizziness, syncope, weakness, light-headedness and headaches.  Psychiatric/Behavioral: Negative for sleep disturbance. The patient is not nervous/anxious.     Patient Active Problem List   Diagnosis Date Noted  . Glaucoma 07/13/2015  . Vitamin B12 deficiency 07/13/2015  . Diabetes (HCC) 12/25/2013    Prior to Admission medications   Medication Sig Start Date End Date Taking? Authorizing Provider  Blood Glucose Monitoring Suppl (GNP EASY TOUCH GLUCOSE METER) DEVI 1 Device by Does not apply route 2 (two) times daily. 07/07/17  Yes Joshalyn Ancheta, Madelaine Bhat, PA-C  brimonidine (ALPHAGAN) 0.15 % ophthalmic solution 1 drop 3 (three) times daily.   Yes [provider]  dorzolamide-timolol (COSOPT) 22.3-6.8 MG/ML ophthalmic solution Place 1 drop into both eyes 2 (two) times daily.   Yes [provider]  metFORMIN (GLUCOPHAGE) 500 MG tablet Take 2 tablets (1,000 mg total) by mouth daily with breakfast. 10/27/17  Yes Silvino Selman, Madelaine Bhat, PA-C  Multiple Vitamin (MULTIVITAMIN) tablet Take 1 tablet by mouth daily.   Yes [provider]  ONETOUCH VERIO test strip USE THREE TO FOUR TIMES DAILY AS DIRECTED BY PHYSICIAN 10/16/17  Yes Sherren Mocha, MD  OVER THE COUNTER MEDICATION  Yes [provider]    No Known Allergies  Past Surgical History:  Procedure Laterality Date  . EYE SURGERY    . KNEE ARTHROSCOPY      Social History   Tobacco Use  . Smoking status: Never Smoker  . Smokeless tobacco: Never Used   Substance Use Topics  . Alcohol use: Yes    Alcohol/week: 0.6 oz    Types: 1 Standard drinks or equivalent per week    Comment: drink on weekend  . Drug use: No    Family History  Problem Relation Age of Onset  . Diabetes Mother   . Hypertension Mother   . Stroke Father   . Diabetes Maternal Grandmother   . Hypertension Paternal Grandfather     Medication list has been reviewed and updated.  Physical Examination:  Physical Exam  Constitutional: He is oriented to person, place, and time. He appears well-developed and well-nourished. No distress.  HENT:  Head: Normocephalic and atraumatic.  Right Ear: External ear normal.  Left Ear: External ear normal.  Nose: Nose normal.  Mouth/Throat: Oropharynx is clear and moist. No oropharyngeal exudate.  Eyes: Pupils are equal, round, and reactive to light. Conjunctivae and EOM are normal. Right eye exhibits no discharge. No scleral icterus.  Neck: Normal range of motion. Neck supple. No tracheal deviation present. No thyromegaly present.  Cardiovascular: Normal rate, regular rhythm and intact distal pulses.  No murmur heard. Pulmonary/Chest: Effort normal and breath sounds normal. No respiratory distress. He has no wheezes.  Abdominal: Soft. Bowel sounds are normal. He exhibits no distension and no mass. There is no tenderness.  Genitourinary: Penis normal. No discharge found.  Musculoskeletal: Normal range of motion.  Lymphadenopathy:    He has no cervical adenopathy.  Neurological: He is alert and oriented to person, place, and time. He has normal reflexes.  Skin: Skin is warm and dry. He is not diaphoretic.  Psychiatric: Judgment normal.  Vitals reviewed.   BP 138/70 (BP Location: Left Arm, Patient Position: Sitting, Cuff Size: Large)   Pulse 62   Temp 98 F (36.7 C) (Oral)   Resp 16   Ht  (1.702 m)   Wt 179 lb 12.8 oz (81.6 kg)   SpO2 99%   BMI 28.16 kg/m  Diabetic Foot Exam - Simple   Simple Foot  Form Diabetic Foot exam was performed with the following findings:  Yes 11/01/2017 12:27 PM  Visual Inspection No deformities, no ulcerations, no other skin breakdown bilaterally:  Yes Sensation Testing Intact to touch and monofilament testing bilaterally:  Yes Pulse Check Posterior Tibialis and Dorsalis pulse intact bilaterally:  Yes Comments    Lab Results  Component Value Date   HGBA1C 7.0 11/04/2016   Results for orders placed or performed in visit on 11/01/17  POCT urinalysis dipstick  Result Value Ref Range   Color, UA yellow yellow   Clarity, UA clear clear   Glucose, UA negative negative mg/dL   Bilirubin, UA negative negative   Ketones, POC UA negative negative mg/dL   Spec Grav, UA 1.610 9.604 - 1.025   Blood, UA negative negative   pH, UA 6.5 5.0 - 8.0   Protein Ur, POC negative negative mg/dL   Urobilinogen, UA 0.2 0.2 or 1.0 E.U./dL   Nitrite, UA Negative Negative   Leukocytes, UA Negative Negative   Lab Results  Component Value Date   PSA 1.30 11/13/2014   PSA 0.72 12/25/2013    Assessment and Plan: 1. Encounter for  annual physical exam - Pt presents for annual exam. He is doing very well. Recently won 3 gold metals at senior games. Routine labs are pending, plan to contact with results. Will decide from A1C when next OV will be.  2. Type 2 diabetes mellitus with complication, without long-term current use of insulin (HCC) - HM Diabetes Foot Exam - CBC - Comprehensive metabolic panel - Lipid panel - Hemoglobin A1c - TSH - Microalbumin, urine - POCT urinalysis dipstick  3. Vitamin B12 deficiency - Vitamin B12  Marco Collie, PA-C  Primary Care at [facility] 11/01/2017 12:33 PM

## 2017-11-02 LAB — COMPREHENSIVE METABOLIC PANEL
ALT: 23 IU/L (ref 0–44)
AST: 29 IU/L (ref 0–40)
Albumin/Globulin Ratio: 1.7 (ref 1.2–2.2)
Alkaline Phosphatase: 71 IU/L (ref 39–117)
BUN: 9 mg/dL (ref 8–27)
Bilirubin Total: 0.5 mg/dL (ref 0.0–1.2)
Chloride: 102 mmol/L (ref 96–106)
GFR calc Af Amer: 88 mL/min/{1.73_m2} (ref >59)
Glucose: 139 mg/dL — ABNORMAL HIGH (ref 65–99)
Potassium: 4.4 mmol/L (ref 3.5–5.2)
Total Protein: 7.2 g/dL (ref 6.0–8.5)

## 2017-11-02 LAB — CBC
Hematocrit: 39.4 % (ref 37.5–51.0)
Hemoglobin: 12 g/dL — ABNORMAL LOW (ref 13.0–17.7)
MCH: 26.6 pg (ref 26.6–33.0)
MCHC: 30.5 g/dL — ABNORMAL LOW (ref 31.5–35.7)
MCV: 87 fL (ref 79–97)
Platelets: 338 10*3/uL (ref 150–379)
RBC: 4.51 x10E6/uL (ref 4.14–5.80)
RDW: 13.7 % (ref 12.3–15.4)
WBC: 5.8 10*3/uL (ref 3.4–10.8)

## 2017-11-02 LAB — COMPREHENSIVE METABOLIC PANEL WITH GFR
Albumin: 4.5 g/dL (ref 3.6–4.8)
BUN/Creatinine Ratio: 9 — ABNORMAL LOW (ref 10–24)
CO2: 23 mmol/L (ref 20–29)
Calcium: 9.3 mg/dL (ref 8.6–10.2)
Creatinine, Ser: 1.03 mg/dL (ref 0.76–1.27)
GFR calc non Af Amer: 76 mL/min/{1.73_m2} (ref >59)
Globulin, Total: 2.7 g/dL (ref 1.5–4.5)
Sodium: 139 mmol/L (ref 134–144)

## 2017-11-02 LAB — MICROALBUMIN, URINE: Microalbumin, Urine: <3 ug/mL

## 2017-11-02 LAB — VITAMIN B12: Vitamin B-12: 936 pg/mL (ref 232–1245)

## 2017-11-02 LAB — TSH: TSH: 1.4 u[IU]/mL (ref 0.450–4.500)

## 2017-11-02 LAB — LIPID PANEL
Chol/HDL Ratio: 3.2 ratio (ref 0.0–5.0)
Cholesterol, Total: 163 mg/dL (ref 100–199)
HDL: 51 mg/dL (ref >39)
LDL Calculated: 101 mg/dL — ABNORMAL HIGH (ref 0–99)
Triglycerides: 54 mg/dL (ref 0–149)
VLDL Cholesterol Cal: 11 mg/dL (ref 5–40)

## 2017-11-02 LAB — HEMOGLOBIN A1C
Est. average glucose Bld gHb Est-mCnc: 166 mg/dL
Hgb A1c MFr Bld: 7.4 % — ABNORMAL HIGH (ref 4.8–5.6)

## 2017-11-15 NOTE — Progress Notes (Signed)
Results released to mychart. A1C a little elevated. Advised lifestyle changes. RTC in 1 year for annual exam.

## 2017-12-16 ENCOUNTER — Other Ambulatory Visit: Payer: Self-pay | Admitting: *Deleted

## 2017-12-16 DIAGNOSIS — E118 Type 2 diabetes mellitus with unspecified complications: Secondary | ICD-10-CM

## 2017-12-16 MED ORDER — ONETOUCH ULTRASOFT LANCETS MISC: 12 refills | Status: DC

## 2017-12-18 ENCOUNTER — Telehealth: Payer: Self-pay | Admitting: Physician Assistant

## 2017-12-18 NOTE — Telephone Encounter (Signed)
Copied from CRM [id number]. Topic: Quick Communication - Rx Refill/Question >> Dec 18, 2017  4:09 PM Cipriano BunkerLambe, Annette S wrote: Medication:   Lancets (ONETOUCH ULTRASOFT) lancets  Pharmacy said they have to have it saying how many times a day is he to use them.  Need directions  He is out and is needed.   Has the patient contacted their pharmacy? Yes.   (Agent: If no, request that the patient contact the pharmacy for the refill.) (Agent: If yes, when and what did the pharmacy advise?)  Preferred Pharmacy (with phone number or street name):   [facility] [address]- [place], [place] - [id number] [place] [id number] Phone: [phone] Fax: [phone]    Agent: Please be advised that RX refills may take up to 3 business days. We ask that you follow-up with your pharmacy.

## 2017-12-19 NOTE — Telephone Encounter (Signed)
Per pt., the pharmacy is requesting Rx for One Touch Ultrasoft lancets to state how many times a day advised to use.  Please update Rx.

## 2017-12-21 ENCOUNTER — Other Ambulatory Visit: Payer: Self-pay

## 2017-12-21 DIAGNOSIS — E118 Type 2 diabetes mellitus with unspecified complications: Secondary | ICD-10-CM

## 2017-12-21 MED ORDER — ONETOUCH ULTRASOFT LANCETS MISC
12 refills | Status: DC
Start: 2017-12-21 — End: 2018-03-01

## 2017-12-21 NOTE — Telephone Encounter (Signed)
Sent instructions 3-4 x per day with dx code E11.8

## 2017-12-25 ENCOUNTER — Other Ambulatory Visit: Payer: Self-pay | Admitting: Family Medicine

## 2017-12-25 DIAGNOSIS — E118 Type 2 diabetes mellitus with unspecified complications: Secondary | ICD-10-CM

## 2018-02-05 DIAGNOSIS — H401133 Primary open-angle glaucoma, bilateral, severe stage: Secondary | ICD-10-CM | POA: Diagnosis not present

## 2018-03-01 ENCOUNTER — Other Ambulatory Visit: Payer: Self-pay

## 2018-03-01 ENCOUNTER — Telehealth: Payer: Self-pay

## 2018-03-01 DIAGNOSIS — E118 Type 2 diabetes mellitus with unspecified complications: Secondary | ICD-10-CM

## 2018-03-01 NOTE — Telephone Encounter (Signed)
Pt arrived to office requesting correction of lancet order.  Received UltraSoft.  Confirmed pt needs OneTouch Delica fine 30 gauge.  [facility]  Per pharmacy, l/m to refill lancets with 1 box of 100 and 3 refills.  Take 3-4 times per day as instructed by provider, Whitney McVey. E11.8

## 2018-03-19 DIAGNOSIS — H401133 Primary open-angle glaucoma, bilateral, severe stage: Secondary | ICD-10-CM | POA: Diagnosis not present

## 2018-05-04 DIAGNOSIS — H401123 Primary open-angle glaucoma, left eye, severe stage: Secondary | ICD-10-CM | POA: Diagnosis not present

## 2018-06-10 ENCOUNTER — Other Ambulatory Visit: Payer: Self-pay | Admitting: Physician Assistant

## 2018-07-06 ENCOUNTER — Ambulatory Visit: Payer: Self-pay | Admitting: Emergency Medicine

## 2018-08-21 ENCOUNTER — Other Ambulatory Visit: Payer: Self-pay | Admitting: Physician Assistant

## 2018-08-29 ENCOUNTER — Other Ambulatory Visit: Payer: Self-pay | Admitting: Physician Assistant

## 2018-10-05 ENCOUNTER — Telehealth (INDEPENDENT_AMBULATORY_CARE_PROVIDER_SITE_OTHER): Payer: Medicare Other | Admitting: Family Medicine

## 2018-10-05 ENCOUNTER — Other Ambulatory Visit: Payer: Self-pay

## 2018-10-05 DIAGNOSIS — E7800 Pure hypercholesterolemia, unspecified: Secondary | ICD-10-CM | POA: Diagnosis not present

## 2018-10-05 DIAGNOSIS — E118 Type 2 diabetes mellitus with unspecified complications: Secondary | ICD-10-CM

## 2018-10-05 MED ORDER — METFORMIN HCL ER 500 MG PO TB24: 1000.0000 mg | ORAL_TABLET | Freq: Every day | ORAL | 1 refills | Status: DC

## 2018-10-05 NOTE — Progress Notes (Signed)
CC- diabetes f/u- Patient stated he is not having any problems at this time. Was just told to call to schedule appt for a f/u. Patient stated his Blood sugar was 200 this morning. Patient was informed to come into the office this morning for blood work. Patient stated he will come in today after virtual visit with you.

## 2018-10-05 NOTE — Patient Instructions (Addendum)
See information below on diet with diabetes.  Cut back on fast food, sweets, sodas, sweet tea or juices. I may recommend a statin cholesterol medicine depending on your levels. Metformin was changed to the extended release as you are taking both pills in the morning.  No other change in dose until we see the results.  Recheck in 3 months.   Type 2 Diabetes Mellitus, Self Care, Adult When you have type 2 diabetes (type 2 diabetes mellitus), you must make sure your blood sugar (glucose) stays in a healthy range. You can do this with:  Nutrition.  Exercise.  Lifestyle changes.  Medicines or insulin, if needed.  Support from your doctors and others. How to stay aware of blood sugar   Check your blood sugar level every day, as often as told.  Have your A1c (hemoglobin A1c) level checked two or more times a year. Have it checked more often if your doctor tells you to. Your doctor will set personal treatment goals for you. Generally, you should have these blood sugar levels:  Before meals (preprandial): 80-130 mg/dL (4.4-7.2 mmol/L).  After meals (postprandial): below 180 mg/dL (10 mmol/L).  A1c level: less than 7%. How to manage high and low blood sugar Signs of high blood sugar High blood sugar is called hyperglycemia. Know the signs of high blood sugar. Signs may include:  Feeling: ? Thirsty. ? Hungry. ? Very tired.  Needing to pee (urinate) more than usual.  Blurry vision. Signs of low blood sugar Low blood sugar is called hypoglycemia. This is when blood sugar is at or below 70 mg/dL (3.9 mmol/L). Signs may include:  Feeling: ? Hungry. ? Worried or nervous (anxious). ? Sweaty and clammy. ? Confused. ? Dizzy. ? Sleepy. ? Sick to your stomach (nauseous).  Having: ? A fast heartbeat. ? A headache. ? A change in your vision. ? Jerky movements that you cannot control (seizure). ? Tingling or no feeling (numbness) around your mouth, lips, or tongue.  Having  trouble with: ? Moving (coordination). ? Sleeping. ? Passing out (fainting). ? Getting upset easily (irritability). Treating low blood sugar To treat low blood sugar, eat or drink something sugary right away. If you can think clearly and swallow safely, follow the 15:15 rule:  Take 15 grams of a fast-acting carb (carbohydrate). Talk with your doctor about how much you should take.  Some fast-acting carbs are: ? Sugar tablets (glucose pills). Take 3-4 pills. ? 6-8 pieces of hard candy. ? 4-6 oz (120-150 mL) of fruit juice. ? 4-6 oz (120-150 mL) of regular (not diet) soda. ? 1 Tbsp (15 mL) honey or sugar.  Check your blood sugar 15 minutes after you take the carb.  If your blood sugar is still at or below 70 mg/dL (3.9 mmol/L), take 15 grams of a carb again.  If your blood sugar does not go above 70 mg/dL (3.9 mmol/L) after 3 tries, get help right away.  After your blood sugar goes back to normal, eat a meal or a snack within 1 hour. Treating very low blood sugar If your blood sugar is at or below 54 mg/dL (3 mmol/L), you have very low blood sugar (severe hypoglycemia). This is an emergency. Do not wait to see if the symptoms will go away. Get medical help right away. Call your local emergency services (911 in the [place] If you have very low blood sugar and you cannot eat or drink, you may need a glucagon shot (injection). A family member or   friend should learn how to check your blood sugar and how to give you a glucagon shot. Ask your doctor if you need to have a glucagon shot kit at home. Follow these instructions at home: Medicine  Take insulin and diabetes medicines as told.  If your doctor says you should take more or less insulin and medicines, do this exactly as told.  Do not run out of insulin or medicines. Having diabetes can raise your risk for other long-term conditions. These include heart disease and kidney disease. Your doctor may prescribe medicines to help you not  have these problems. Food   Make healthy food choices. These include: ? Chicken, fish, egg whites, and beans. ? Oats, whole wheat, bulgur, brown rice, quinoa, and millet. ? Fresh fruits and vegetables. ? Low-fat dairy products. ? Nuts, avocado, olive oil, and canola oil.  Meet with a food specialist (dietitian). He or she can help you make an eating plan that is right for you.  Follow instructions from your doctor about what you cannot eat or drink.  Drink enough fluid to keep your pee (urine) pale yellow.  Keep track of carbs that you eat. Do this by reading food labels and learning food serving sizes.  Follow your sick day plan when you cannot eat or drink normally. Make this plan with your doctor so it is ready to use. Activity  Exercise 3 or more times a week.  Do not go more than 2 days without exercising.  Talk with your doctor before you start a new exercise. Your doctor may need to tell you to change: ? How much insulin or medicines you take. ? How much food you eat. Lifestyle  Do not use any tobacco products. These include cigarettes, chewing tobacco, and e-cigarettes. If you need help quitting, ask your doctor.  Ask your doctor how much alcohol is safe for you.  Learn to deal with stress. If you need help with this, ask your doctor. Body care   Stay up to date with your shots (immunizations).  Have your eyes and feet checked by a doctor as often as told.  Check your skin and feet every day. Check for cuts, bruises, redness, blisters, or sores.  Brush your teeth and gums two times a day. Floss one or more times a day.  Go to the dentist one or more times every 6 months.  Stay at a healthy weight. General instructions  Take over-the-counter and prescription medicines only as told by your doctor.  Share your diabetes care plan with: ? Your work or school. ? People you live with.  Carry a card or wear jewelry that says you have diabetes.  Keep all  follow-up visits as told by your doctor. This is important. Questions to ask your doctor  Do I need to meet with a diabetes educator?  Where can I find a support group for people with diabetes? Where to find more information To learn more about diabetes, visit:  American Diabetes Association: www.diabetes.org  American Association of Diabetes Educators: www.diabeteseducator.org Summary  When you have type 2 diabetes, you must make sure your blood sugar (glucose) stays in a healthy range.  Check your blood sugar every day, as often as told.  Having diabetes can raise your risk for other conditions. Your doctor may prescribe medicines to help you not have these problems.  Keep all follow-up visits as told by your doctor. This is important. This information is not intended to replace advice given to you   by your health care provider. Make sure you discuss any questions you have with your health care provider. Document Released: 09/28/2015 Document Revised: 11/27/2017 Document Reviewed: 07/10/2015 Elsevier Interactive Patient Education  2019 [employer].   Diabetes Mellitus and Nutrition, Adult When you have diabetes (diabetes mellitus), it is very important to have healthy eating habits because your blood sugar (glucose) levels are greatly affected by what you eat and drink. Eating healthy foods in the appropriate amounts, at about the same times every day, can help you:  Control your blood glucose.  Lower your risk of heart disease.  Improve your blood pressure.  Reach or maintain a healthy weight. Every person with diabetes is different, and each person has different needs for a meal plan. Your health care provider may recommend that you work with a diet and nutrition specialist (dietitian) to make a meal plan that is best for you. Your meal plan may vary depending on factors such as:  The calories you need.  The medicines you take.  Your weight.  Your blood glucose, blood  pressure, and cholesterol levels.  Your activity level.  Other health conditions you have, such as heart or kidney disease. How do carbohydrates affect me? Carbohydrates, also called carbs, affect your blood glucose level more than any other type of food. Eating carbs naturally raises the amount of glucose in your blood. Carb counting is a method for keeping track of how many carbs you eat. Counting carbs is important to keep your blood glucose at a healthy level, especially if you use insulin or take certain oral diabetes medicines. It is important to know how many carbs you can safely have in each meal. This is different for every person. Your dietitian can help you calculate how many carbs you should have at each meal and for each snack. Foods that contain carbs include:  Bread, cereal, rice, pasta, and crackers.  Potatoes and corn.  Peas, beans, and lentils.  Milk and yogurt.  Fruit and juice.  Desserts, such as cakes, cookies, ice cream, and candy. How does alcohol affect me? Alcohol can cause a sudden decrease in blood glucose (hypoglycemia), especially if you use insulin or take certain oral diabetes medicines. Hypoglycemia can be a life-threatening condition. Symptoms of hypoglycemia (sleepiness, dizziness, and confusion) are similar to symptoms of having too much alcohol. If your health care provider says that alcohol is safe for you, follow these guidelines:  Limit alcohol intake to no more than 1 drink per day for nonpregnant women and 2 drinks per day for men. One drink equals 12 oz of beer, 5 oz of wine, or 1 oz of hard liquor.  Do not drink on an empty stomach.  Keep yourself hydrated with water, diet soda, or unsweetened iced tea.  Keep in mind that regular soda, juice, and other mixers may contain a lot of sugar and must be counted as carbs. What are tips for following this plan?  Reading food labels  Start by checking the serving size on the "Nutrition Facts"  label of packaged foods and drinks. The amount of calories, carbs, fats, and other nutrients listed on the label is based on one serving of the item. Many items contain more than one serving per package.  Check the total grams (g) of carbs in one serving. You can calculate the number of servings of carbs in one serving by dividing the total carbs by 15. For example, if a food has 30 g of total carbs, it would  be equal to 2 servings of carbs.  Check the number of grams (g) of saturated and trans fats in one serving. Choose foods that have low or no amount of these fats.  Check the number of milligrams (mg) of salt (sodium) in one serving. Most people should limit total sodium intake to less than 2,300 mg per day.  Always check the nutrition information of foods labeled as "low-fat" or "nonfat". These foods may be higher in added sugar or refined carbs and should be avoided.  Talk to your dietitian to identify your daily goals for nutrients listed on the label. Shopping  Avoid buying canned, premade, or processed foods. These foods tend to be high in fat, sodium, and added sugar.  Shop around the outside edge of the grocery store. This includes fresh fruits and vegetables, bulk grains, fresh meats, and fresh dairy. Cooking  Use low-heat cooking methods, such as baking, instead of high-heat cooking methods like deep frying.  Cook using healthy oils, such as olive, canola, or sunflower oil.  Avoid cooking with butter, cream, or high-fat meats. Meal planning  Eat meals and snacks regularly, preferably at the same times every day. Avoid going long periods of time without eating.  Eat foods high in fiber, such as fresh fruits, vegetables, beans, and whole grains. Talk to your dietitian about how many servings of carbs you can eat at each meal.  Eat 4-6 ounces (oz) of lean protein each day, such as lean meat, chicken, fish, eggs, or tofu. One oz of lean protein is equal to: ? 1 oz of meat,  chicken, or fish. ? 1 egg. ?  cup of tofu.  Eat some foods each day that contain healthy fats, such as avocado, nuts, seeds, and fish. Lifestyle  Check your blood glucose regularly.  Exercise regularly as told by your health care provider. This may include: ? 150 minutes of moderate-intensity or vigorous-intensity exercise each week. This could be brisk walking, biking, or water aerobics. ? Stretching and doing strength exercises, such as yoga or weightlifting, at least 2 times a week.  Take medicines as told by your health care provider.  Do not use any products that contain nicotine or tobacco, such as cigarettes and e-cigarettes. If you need help quitting, ask your health care provider.  Work with a Social worker or diabetes educator to identify strategies to manage stress and any emotional and social challenges. Questions to ask a health care provider  Do I need to meet with a diabetes educator?  Do I need to meet with a dietitian?  What number can I call if I have questions?  When are the best times to check my blood glucose? Where to find more information:  American Diabetes Association: diabetes.org  Academy of Nutrition and Dietetics: www.eatright.[employer] of Diabetes and Digestive and Kidney Diseases (NIH): [employer] Summary  A healthy meal plan will help you control your blood glucose and maintain a healthy lifestyle.  Working with a diet and nutrition specialist (dietitian) can help you make a meal plan that is best for you.  Keep in mind that carbohydrates (carbs) and alcohol have immediate effects on your blood glucose levels. It is important to count carbs and to use alcohol carefully. This information is not intended to replace advice given to you by your health care provider. Make sure you discuss any questions you have with your health care provider. Document Released: 03/03/2005 Document Revised: 01/04/2017 Document Reviewed:  07/11/2016 Elsevier Interactive Patient Education  2019 [employer]    If you have lab work done today you will be contacted with your lab results within the next 2 weeks.  If you have not heard from us then please contact us. The fastest way to get your results is to register for My Chart.   IF you received an x-ray today, you will receive an invoice from [facility]. Please contact [facility] at [phone] with questions or concerns regarding your invoice.   IF you received labwork today, you will receive an invoice from [facility]. Please contact [facility] at [phone] with questions or concerns regarding your invoice.   Our billing staff will not be able to assist you with questions regarding bills from these companies.  You will be contacted with the lab results as soon as they are available. The fastest way to get your results is to activate your My Chart account. Instructions are located on the last page of this paperwork. If you have not heard from us regarding the results in 2 weeks, please contact this office.      

## 2018-10-05 NOTE — Progress Notes (Signed)
Virtual Visit via Telephone Note  I connected with Randy Mcbride on 10/05/18 at 10:04 AM by telephone and verified that I am speaking with the correct person using two identifiers.   I discussed the limitations, risks, security and privacy concerns of performing an evaluation and management service by telephone and the availability of in person appointments. I also discussed with the patient that there may be a patient responsible charge related to this service. The patient expressed understanding and agreed to proceed, consent obtained  Chief complaint:  Diabetes  History of Present Illness: Randy Mcbride is a 63 y.o. male Please use patient of Randy Mcbride, but has not been seen since May 2019.  Diabetes:  Currently takes metformin 1000 mg daily. No new side effects.  Home readings - no recent ones.  Some fast food, soda and sweet tea, juices more than soda.  Home weight 180-182.  Exercise: cardio tues and thursdays, 13k steps per day.  Wt Readings from Last 3 Encounters:  11/01/17 179 lb 12.8 oz (81.6 kg)  08/23/17 181 lb 9.6 oz (82.4 kg)  11/04/16 180 lb (81.6 kg)  Microalbumin: Normal 11/01/2017 Optho, foot exam, pneumovax: Due for Pneumovax - refuses at this time.  Appointment with ophthalmology February 05, 2018.  History of glaucoma bilaterally.  On eyedrops from ophthalmology, Cosopt, Alphagan. Not currently on ACE or statin. Not sure he would like to be on statin, but will determine after blood work.   BP Readings from Last 3 Encounters:  11/01/17 138/70  08/23/17 124/72  11/04/16 122/79   Lab Results  Component Value Date   HGBA1C 7.4 (H) 11/01/2017   HGBA1C 7.0 11/04/2016   HGBA1C 10.8 08/02/2016   Lab Results  Component Value Date   MICROALBUR 0.4 11/13/2014   LDLCALC 101 (H) 11/01/2017   CREATININE 1.03 11/01/2017      Patient Active Problem List   Diagnosis Date Noted  . Glaucoma 07/13/2015  . Vitamin B12 deficiency 07/13/2015  . Diabetes  ([place]) 12/25/2013   Past Medical History:  Diagnosis Date  . Cataract   . Diabetes mellitus without complication ([place])   . Glaucoma    Past Surgical History:  Procedure Laterality Date  . EYE SURGERY    . KNEE ARTHROSCOPY     No Known Allergies Prior to Admission medications   Medication Sig Start Date End Date Taking? Authorizing Provider  Blood Glucose Monitoring Suppl (GNP EASY TOUCH GLUCOSE METER) DEVI 1 Device by Does not apply route 2 (two) times daily. 07/07/17  Yes McVey, Gelene Mink, PA-C  brimonidine (ALPHAGAN) 0.15 % ophthalmic solution 1 drop 3 (three) times daily.   Yes [provider]  dorzolamide-timolol (COSOPT) 22.3-6.8 MG/ML ophthalmic solution Place 1 drop into both eyes 2 (two) times daily.   Yes [provider]  metFORMIN (GLUCOPHAGE) 500 MG tablet TAKE 2 TABLETS BY MOUTH ONCE DAILY WITH BREAKFAST 08/29/18  Yes Wendie Agreste, MD  Multiple Vitamin (MULTIVITAMIN) tablet Take 1 tablet by mouth daily.   Yes [provider]  [facility] VERIO test strip  USE 3 TO 4 TIMES DAILY AS DIRECTED BY PHYSICIAN 12/26/17  Yes Shawnee Knapp, MD  OVER THE COUNTER MEDICATION    Yes [provider]   Social History   Socioeconomic History  . Marital status: Married    Spouse name: Not on file  . Number of children: Not on file  . Years of education: Not on file  . Highest education level: Not on  file  Occupational History  . Not on file  Social Needs  . Financial resource strain: Not on file  . Food insecurity:    Worry: Not on file    Inability: Not on file  . Transportation needs:    Medical: Not on file    Non-medical: Not on file  Tobacco Use  . Smoking status: Never Smoker  . Smokeless tobacco: Never Used  Substance and Sexual Activity  . Alcohol use: Yes    Alcohol/week: 1.0 standard drinks    Types: 1 Standard drinks or equivalent per week    Comment: drink on weekend  . Drug use: No  . Sexual activity: Never  Lifestyle   . Physical activity:    Days per week: Not on file    Minutes per session: Not on file  . Stress: Not on file  Relationships  . Social connections:    Talks on phone: Not on file    Gets together: Not on file    Attends religious service: Not on file    Active member of club or organization: Not on file    Attends meetings of clubs or organizations: Not on file    Relationship status: Not on file  . Intimate partner violence:    Fear of current or ex partner: Not on file    Emotionally abused: Not on file    Physically abused: Not on file    Forced sexual activity: Not on file  Other Topics Concern  . Not on file  Social History Narrative  . Not on file     Observations/Objective: No distress on phone, appropriate responses, all questions answered  Assessment and Plan: Elevated LDL cholesterol level - Plan: Lipid Panel  -Check lipid panel, discussed potential for statin given diabetes but can decide on dosing once labs return.  Potential side effects and risk of statins were discussed but also benefit with history of diabetes.  Type 2 diabetes mellitus with complication, without long-term current use of insulin ([place]) - Plan: Comprehensive metabolic panel, Hemoglobin A1c  -Dietary guidance given with handout.  Based on dosing intervals will change metformin to extended release.  Overall same dose.  Recheck 3 months.  Follow Up Instructions:  3 months.   Patient Instructions    See information below on diet with diabetes.  Cut back on fast food, sweets, sodas, sweet tea or juices. I may recommend a statin cholesterol medicine depending on your levels. Metformin was changed to the extended release as you are taking both pills in the morning.  No other change in dose until we see the results.  Recheck in 3 months.   Type 2 Diabetes Mellitus, Self Care, Adult When you have type 2 diabetes (type 2 diabetes mellitus), you must make sure your blood sugar (glucose) stays in a  healthy range. You can do this with:  Nutrition.  Exercise.  Lifestyle changes.  Medicines or insulin, if needed.  Support from your doctors and others. How to stay aware of blood sugar   Check your blood sugar level every day, as often as told.  Have your A1c (hemoglobin A1c) level checked two or more times a year. Have it checked more often if your doctor tells you to. Your doctor will set personal treatment goals for you. Generally, you should have these blood sugar levels:  Before meals (preprandial): 80-130 mg/dL (4.4-7.2 mmol/L).  After meals (postprandial): below 180 mg/dL (10 mmol/L).  A1c level: less than 7%. How to manage  high and low blood sugar Signs of high blood sugar High blood sugar is called hyperglycemia. Know the signs of high blood sugar. Signs may include:  Feeling: ? Thirsty. ? Hungry. ? Very tired.  Needing to pee (urinate) more than usual.  Blurry vision. Signs of low blood sugar Low blood sugar is called hypoglycemia. This is when blood sugar is at or below 70 mg/dL (3.9 mmol/L). Signs may include:  Feeling: ? Hungry. ? Worried or nervous (anxious). ? Sweaty and clammy. ? Confused. ? Dizzy. ? Sleepy. ? Sick to your stomach (nauseous).  Having: ? A fast heartbeat. ? A headache. ? A change in your vision. ? Jerky movements that you cannot control (seizure). ? Tingling or no feeling (numbness) around your mouth, lips, or tongue.  Having trouble with: ? Moving (coordination). ? Sleeping. ? Passing out (fainting). ? Getting upset easily (irritability). Treating low blood sugar To treat low blood sugar, eat or drink something sugary right away. If you can think clearly and swallow safely, follow the 15:15 rule:  Take 15 grams of a fast-acting carb (carbohydrate). Talk with your doctor about how much you should take.  Some fast-acting carbs are: ? Sugar tablets (glucose pills). Take 3-4 pills. ? 6-8 pieces of hard candy. ? 4-6 oz  (120-150 mL) of fruit juice. ? 4-6 oz (120-150 mL) of regular (not diet) soda. ? 1 Tbsp (15 mL) honey or sugar.  Check your blood sugar 15 minutes after you take the carb.  If your blood sugar is still at or below 70 mg/dL (3.9 mmol/L), take 15 grams of a carb again.  If your blood sugar does not go above 70 mg/dL (3.9 mmol/L) after 3 tries, get help right away.  After your blood sugar goes back to normal, eat a meal or a snack within 1 hour. Treating very low blood sugar If your blood sugar is at or below 54 mg/dL (3 mmol/L), you have very low blood sugar (severe hypoglycemia). This is an emergency. Do not wait to see if the symptoms will go away. Get medical help right away. Call your local emergency services (911 in the [place] If you have very low blood sugar and you cannot eat or drink, you may need a glucagon shot (injection). A family member or friend should learn how to check your blood sugar and how to give you a glucagon shot. Ask your doctor if you need to have a glucagon shot kit at home. Follow these instructions at home: Medicine  Take insulin and diabetes medicines as told.  If your doctor says you should take more or less insulin and medicines, do this exactly as told.  Do not run out of insulin or medicines. Having diabetes can raise your risk for other long-term conditions. These include heart disease and kidney disease. Your doctor may prescribe medicines to help you not have these problems. Food   Make healthy food choices. These include: ? Chicken, fish, egg whites, and beans. ? Oats, whole wheat, bulgur, brown rice, quinoa, and millet. ? Fresh fruits and vegetables. ? Low-fat dairy products. ? Nuts, avocado, olive oil, and canola oil.  Meet with a food specialist (dietitian). He or she can help you make an eating plan that is right for you.  Follow instructions from your doctor about what you cannot eat or drink.  Drink enough fluid to keep your pee (urine)  pale yellow.  Keep track of carbs that you eat. Do this by reading food labels and  learning food serving sizes.  Follow your sick day plan when you cannot eat or drink normally. Make this plan with your doctor so it is ready to use. Activity  Exercise 3 or more times a week.  Do not go more than 2 days without exercising.  Talk with your doctor before you start a new exercise. Your doctor may need to tell you to change: ? How much insulin or medicines you take. ? How much food you eat. Lifestyle  Do not use any tobacco products. These include cigarettes, chewing tobacco, and e-cigarettes. If you need help quitting, ask your doctor.  Ask your doctor how much alcohol is safe for you.  Learn to deal with stress. If you need help with this, ask your doctor. Body care   Stay up to date with your shots (immunizations).  Have your eyes and feet checked by a doctor as often as told.  Check your skin and feet every day. Check for cuts, bruises, redness, blisters, or sores.  Brush your teeth and gums two times a day. Floss one or more times a day.  Go to the dentist one or more times every 6 months.  Stay at a healthy weight. General instructions  Take over-the-counter and prescription medicines only as told by your doctor.  Share your diabetes care plan with: ? Your work or school. ? People you live with.  Carry a card or wear jewelry that says you have diabetes.  Keep all follow-up visits as told by your doctor. This is important. Questions to ask your doctor  Do I need to meet with a diabetes educator?  Where can I find a support group for people with diabetes? Where to find more information To learn more about diabetes, visit:  American Diabetes Association: www.diabetes.org  American Association of Diabetes Educators: www.diabeteseducator.org Summary  When you have type 2 diabetes, you must make sure your blood sugar (glucose) stays in a healthy range.  Check  your blood sugar every day, as often as told.  Having diabetes can raise your risk for other conditions. Your doctor may prescribe medicines to help you not have these problems.  Keep all follow-up visits as told by your doctor. This is important. This information is not intended to replace advice given to you by your health care provider. Make sure you discuss any questions you have with your health care provider. Document Released: 09/28/2015 Document Revised: 11/27/2017 Document Reviewed: 07/10/2015 Elsevier Interactive Patient Education  2019 [employer].   Diabetes Mellitus and Nutrition, Adult When you have diabetes (diabetes mellitus), it is very important to have healthy eating habits because your blood sugar (glucose) levels are greatly affected by what you eat and drink. Eating healthy foods in the appropriate amounts, at about the same times every day, can help you:  Control your blood glucose.  Lower your risk of heart disease.  Improve your blood pressure.  Reach or maintain a healthy weight. Every person with diabetes is different, and each person has different needs for a meal plan. Your health care provider may recommend that you work with a diet and nutrition specialist (dietitian) to make a meal plan that is best for you. Your meal plan may vary depending on factors such as:  The calories you need.  The medicines you take.  Your weight.  Your blood glucose, blood pressure, and cholesterol levels.  Your activity level.  Other health conditions you have, such as heart or kidney disease. How do carbohydrates affect  me? Carbohydrates, also called carbs, affect your blood glucose level more than any other type of food. Eating carbs naturally raises the amount of glucose in your blood. Carb counting is a method for keeping track of how many carbs you eat. Counting carbs is important to keep your blood glucose at a healthy level, especially if you use insulin or take  certain oral diabetes medicines. It is important to know how many carbs you can safely have in each meal. This is different for every person. Your dietitian can help you calculate how many carbs you should have at each meal and for each snack. Foods that contain carbs include:  Bread, cereal, rice, pasta, and crackers.  Potatoes and corn.  Peas, beans, and lentils.  Milk and yogurt.  Fruit and juice.  Desserts, such as cakes, cookies, ice cream, and candy. How does alcohol affect me? Alcohol can cause a sudden decrease in blood glucose (hypoglycemia), especially if you use insulin or take certain oral diabetes medicines. Hypoglycemia can be a life-threatening condition. Symptoms of hypoglycemia (sleepiness, dizziness, and confusion) are similar to symptoms of having too much alcohol. If your health care provider says that alcohol is safe for you, follow these guidelines:  Limit alcohol intake to no more than 1 drink per day for nonpregnant women and 2 drinks per day for men. One drink equals 12 oz of beer, 5 oz of wine, or 1 oz of hard liquor.  Do not drink on an empty stomach.  Keep yourself hydrated with water, diet soda, or unsweetened iced tea.  Keep in mind that regular soda, juice, and other mixers may contain a lot of sugar and must be counted as carbs. What are tips for following this plan?  Reading food labels  Start by checking the serving size on the "Nutrition Facts" label of packaged foods and drinks. The amount of calories, carbs, fats, and other nutrients listed on the label is based on one serving of the item. Many items contain more than one serving per package.  Check the total grams (g) of carbs in one serving. You can calculate the number of servings of carbs in one serving by dividing the total carbs by 15. For example, if a food has 30 g of total carbs, it would be equal to 2 servings of carbs.  Check the number of grams (g) of saturated and trans fats in one  serving. Choose foods that have low or no amount of these fats.  Check the number of milligrams (mg) of salt (sodium) in one serving. Most people should limit total sodium intake to less than 2,300 mg per day.  Always check the nutrition information of foods labeled as "low-fat" or "nonfat". These foods may be higher in added sugar or refined carbs and should be avoided.  Talk to your dietitian to identify your daily goals for nutrients listed on the label. Shopping  Avoid buying canned, premade, or processed foods. These foods tend to be high in fat, sodium, and added sugar.  Shop around the outside edge of the grocery store. This includes fresh fruits and vegetables, bulk grains, fresh meats, and fresh dairy. Cooking  Use low-heat cooking methods, such as baking, instead of high-heat cooking methods like deep frying.  Cook using healthy oils, such as olive, canola, or sunflower oil.  Avoid cooking with butter, cream, or high-fat meats. Meal planning  Eat meals and snacks regularly, preferably at the same times every day. Avoid going long periods of time  without eating.  Eat foods high in fiber, such as fresh fruits, vegetables, beans, and whole grains. Talk to your dietitian about how many servings of carbs you can eat at each meal.  Eat 4-6 ounces (oz) of lean protein each day, such as lean meat, chicken, fish, eggs, or tofu. One oz of lean protein is equal to: ? 1 oz of meat, chicken, or fish. ? 1 egg. ?  cup of tofu.  Eat some foods each day that contain healthy fats, such as avocado, nuts, seeds, and fish. Lifestyle  Check your blood glucose regularly.  Exercise regularly as told by your health care provider. This may include: ? 150 minutes of moderate-intensity or vigorous-intensity exercise each week. This could be brisk walking, biking, or water aerobics. ? Stretching and doing strength exercises, such as yoga or weightlifting, at least 2 times a week.  Take medicines  as told by your health care provider.  Do not use any products that contain nicotine or tobacco, such as cigarettes and e-cigarettes. If you need help quitting, ask your health care provider.  Work with a Social worker or diabetes educator to identify strategies to manage stress and any emotional and social challenges. Questions to ask a health care provider  Do I need to meet with a diabetes educator?  Do I need to meet with a dietitian?  What number can I call if I have questions?  When are the best times to check my blood glucose? Where to find more information:  American Diabetes Association: diabetes.org  Academy of Nutrition and Dietetics: www.eatright.[employer] of Diabetes and Digestive and Kidney Diseases (NIH): [employer] Summary  A healthy meal plan will help you control your blood glucose and maintain a healthy lifestyle.  Working with a diet and nutrition specialist (dietitian) can help you make a meal plan that is best for you.  Keep in mind that carbohydrates (carbs) and alcohol have immediate effects on your blood glucose levels. It is important to count carbs and to use alcohol carefully. This information is not intended to replace advice given to you by your health care provider. Make sure you discuss any questions you have with your health care provider. Document Released: 03/03/2005 Document Revised: 01/04/2017 Document Reviewed: 07/11/2016 Elsevier Interactive Patient Education  [employer].    If you have lab work done today you will be contacted with your lab results within the next 2 weeks.  If you have not heard from [place] then please contact us. The fastest way to get your results is to register for My Chart.   IF you received an x-ray today, you will receive an invoice from [facility]. Please contact [facility] at [phone] with questions or concerns regarding your invoice.   IF you received labwork today,  you will receive an invoice from [facility]. Please contact [facility] at [phone] with questions or concerns regarding your invoice.   Our billing staff will not be able to assist you with questions regarding bills from these companies.  You will be contacted with the lab results as soon as they are available. The fastest way to get your results is to activate your My Chart account. Instructions are located on the last page of this paperwork. If you have not heard from [place] regarding the results in 2 weeks, please contact this office.          I discussed the assessment and treatment plan with the patient. The patient was provided an opportunity to  ask questions and all were answered. The patient agreed with the plan and demonstrated an understanding of the instructions.   The patient was advised to call back or seek an in-person evaluation if the symptoms worsen or if the condition fails to improve as anticipated.  I provided 10 minutes of non-face-to-face time during this encounter.  Signed,   Merri Ray, MD [facility] [place].  10/05/18

## 2018-10-06 LAB — COMPREHENSIVE METABOLIC PANEL
ALT: 19 IU/L (ref 0–44)
AST: 21 IU/L (ref 0–40)
Albumin/Globulin Ratio: 1.8 (ref 1.2–2.2)
Albumin: 4.1 g/dL (ref 3.8–4.8)
Alkaline Phosphatase: 80 IU/L (ref 39–117)
BUN/Creatinine Ratio: 9 — ABNORMAL LOW (ref 10–24)
BUN: 9 mg/dL (ref 8–27)
Bilirubin Total: 0.6 mg/dL (ref 0.0–1.2)
CO2: 22 mmol/L (ref 20–29)
Calcium: 9 mg/dL (ref 8.6–10.2)
Chloride: 97 mmol/L (ref 96–106)
Creatinine, Ser: 1.02 mg/dL (ref 0.76–1.27)
GFR calc Af Amer: 89 mL/min/{1.73_m2} (ref >59)
GFR calc non Af Amer: 77 mL/min/{1.73_m2} (ref >59)
Globulin, Total: 2.3 g/dL (ref 1.5–4.5)
Glucose: 266 mg/dL — ABNORMAL HIGH (ref 65–99)
Potassium: 4.4 mmol/L (ref 3.5–5.2)
Sodium: 135 mmol/L (ref 134–144)
Total Protein: 6.4 g/dL (ref 6.0–8.5)

## 2018-10-06 LAB — LIPID PANEL
Chol/HDL Ratio: 4 ratio (ref 0.0–5.0)
Cholesterol, Total: 175 mg/dL (ref 100–199)
HDL: 44 mg/dL (ref >39)
LDL Calculated: 109 mg/dL — ABNORMAL HIGH (ref 0–99)
Triglycerides: 112 mg/dL (ref 0–149)
VLDL Cholesterol Cal: 22 mg/dL (ref 5–40)

## 2018-10-06 LAB — MICROALBUMIN / CREATININE URINE RATIO
Creatinine, Urine: 145.9 mg/dL
Microalb/Creat Ratio: 4 mg/g creat (ref 0–29)
Microalbumin, Urine: 5.6 ug/mL

## 2018-10-06 LAB — HEMOGLOBIN A1C
Est. average glucose Bld gHb Est-mCnc: 217 mg/dL
Hgb A1c MFr Bld: 9.2 % — ABNORMAL HIGH (ref 4.8–5.6)

## 2018-12-04 ENCOUNTER — Ambulatory Visit: Payer: Medicare Other | Admitting: Family Medicine

## 2018-12-21 ENCOUNTER — Ambulatory Visit: Payer: Medicare Other | Admitting: Registered Nurse

## 2019-01-17 ENCOUNTER — Other Ambulatory Visit: Payer: Self-pay

## 2019-01-17 DIAGNOSIS — Z20822 Contact with and (suspected) exposure to covid-19: Secondary | ICD-10-CM

## 2019-01-19 ENCOUNTER — Telehealth: Payer: Self-pay

## 2019-01-19 LAB — NOVEL CORONAVIRUS, NAA: SARS-CoV-2, NAA: DETECTED — AB

## 2019-01-19 NOTE — Telephone Encounter (Signed)
Call received this am re: positive Covid 19 results. Covid risk of complication score 3.   Called patient. Verified that he received results. Just had testing done as daughter had been positive for covid. Thought he had a summer cold. Cough, congestion, sore to cough.  Only remaining symptom is cough but better. Breathing ok, less congestion and no pain to cough. No fever. tmax 99 past few days, 97.5 this morning. 1st symptom 7-10 days ago - sore throat. tx - mucinex DM.   Continue self isolation, ok to use tylenol, mucinex.  10 days isolation at least and recheck with telemed visit in few days. ER precautions discussed.

## 2019-01-19 NOTE — Telephone Encounter (Signed)
Pt called to make sure his results meant that he was positive for Covid 19. Discussed criteria for ending self isolation and non test criteria for ending self isolation. Pt still coughing and advised to stay on quarantine.

## 2019-01-23 ENCOUNTER — Telehealth: Payer: Self-pay | Admitting: Family Medicine

## 2019-01-23 ENCOUNTER — Telehealth (INDEPENDENT_AMBULATORY_CARE_PROVIDER_SITE_OTHER): Payer: Medicare Other | Admitting: Family Medicine

## 2019-01-23 ENCOUNTER — Other Ambulatory Visit: Payer: Self-pay

## 2019-01-23 ENCOUNTER — Encounter: Payer: Self-pay | Admitting: Family Medicine

## 2019-01-23 DIAGNOSIS — U071 COVID-19: Secondary | ICD-10-CM | POA: Diagnosis not present

## 2019-01-23 NOTE — Progress Notes (Signed)
Virtual Visit via Telephone Note  I connected with Randy Mcbride on 01/23/19 at 5:19 PM by telephone and verified that I am speaking with the correct person using two identifiers.   I discussed the limitations, risks, security and privacy concerns of performing an evaluation and management service by telephone and the availability of in person appointments. I also discussed with the patient that there may be a patient responsible charge related to this service. The patient expressed understanding and agreed to proceed, consent obtained  Chief complaint: Covid 19 follow up .   History of Present Illness: Randy Mcbride is a 63 y.o. male  Covid 4619: See telephone note, 8/1.  Positive covid test noted last Saturday, tested 6 days ago.  Initial sxs of summer cold - cough, congestion, sore throat earlier. Had prior diarrhea, change in smell prior.   Thinks initial sx's may have been on 7/19 - cough, sore throat, sneezing, cough. Good days and bad days some continued symptoms since 7/19.  Worst symptoms were 1 week ago - weak, worse cough, and aches in body with cough.  Had been improving when I called him on 8/1 - only minimal residual cough. Feels healed at this time. No cough, no symptoms at all this time. Part time work- not working, still isolating self.    Tx: lemon and honey.   History of diabetes. Due for follow up.  Glucose readings few weeks ago in 200's. 141 today Plans on in office eval in next 2 weeks.    Patient Active Problem List   Diagnosis Date Noted  . Glaucoma 07/13/2015  . Vitamin B12 deficiency 07/13/2015  . Diabetes (HCC) 12/25/2013   Past Medical History:  Diagnosis Date  . Cataract   . Diabetes mellitus without complication (HCC)   . Glaucoma    Past Surgical History:  Procedure Laterality Date  . EYE SURGERY    . KNEE ARTHROSCOPY     No Known Allergies Prior to Admission medications   Medication Sig Start Date End Date Taking? Authorizing Provider   Blood Glucose Monitoring Suppl (GNP EASY TOUCH GLUCOSE METER) DEVI 1 Device by Does not apply route 2 (two) times daily. 07/07/17   McVey, Madelaine BhatElizabeth Whitney, PA-C  brimonidine (ALPHAGAN) 0.15 % ophthalmic solution 1 drop 3 (three) times daily.    [provider]  dorzolamide-timolol (COSOPT) 22.3-6.8 MG/ML ophthalmic solution Place 1 drop into both eyes 2 (two) times daily.    [provider]  metFORMIN (GLUCOPHAGE XR) 500 MG 24 hr tablet Take 2 tablets (1,000 mg total) by mouth daily with breakfast. 10/05/18   Shade FloodGreene, Zacary Bauer R, MD  Multiple Vitamin (MULTIVITAMIN) tablet Take 1 tablet by mouth daily.    [provider]  [facility] test strip  USE 3 TO 4 TIMES DAILY AS DIRECTED BY PHYSICIAN 12/26/17   Sherren MochaShaw, Eva N, MD  OVER THE COUNTER MEDICATION     [provider]   Social History   Socioeconomic History  . Marital status: Married    Spouse name: Not on file  . Number of children: Not on file  . Years of education: Not on file  . Highest education level: Not on file  Occupational History  . Not on file  Social Needs  . Financial resource strain: Not on file  . Food insecurity    Worry: Not on file    Inability: Not on file  . Transportation needs    Medical: Not on file  Non-medical: Not on file  Tobacco Use  . Smoking status: Never Smoker  . Smokeless tobacco: Never Used  Substance and Sexual Activity  . Alcohol use: Yes    Alcohol/week: 1.0 standard drinks    Types: 1 Standard drinks or equivalent per week    Comment: drink on weekend  . Drug use: No  . Sexual activity: Never  Lifestyle  . Physical activity    Days per week: Not on file    Minutes per session: Not on file  . Stress: Not on file  Relationships  . Social Herbalist on phone: Not on file    Gets together: Not on file    Attends religious service: Not on file    Active member of club or organization: Not on file    Attends meetings of clubs or  organizations: Not on file    Relationship status: Not on file  . Intimate partner violence    Fear of current or ex partner: Not on file    Emotionally abused: Not on file    Physically abused: Not on file    Forced sexual activity: Not on file  Other Topics Concern  . Not on file  Social History Narrative  . Not on file     Observations/Objective: Speaking normally in full sentences, no distress.  There were no vitals filed for this visit. No home vitals   Assessment and Plan: COVID-19 virus infection - Plan:  Symptoms have resolved as above.  May have had initial symptoms earlier in July than expected, so likely is less contagious at this time as at least 10 days from onset of symptoms.  RTC precautions discussed.  Follow Up Instructions:   as needed.   Patient Instructions  Good talking today. Glad to hear you are doing much better. Once you are 10 days from infection symptoms, you should be less contagious.  Return to the clinic or go to the nearest emergency room if any of your symptoms worsen or new symptoms occur.   Follow up in 2 weeks for diabetes.    I discussed the assessment and treatment plan with the patient. The patient was provided an opportunity to ask questions and all were answered. The patient agreed with the plan and demonstrated an understanding of the instructions.   The patient was advised to call back or seek an in-person evaluation if the symptoms worsen or if the condition fails to improve as anticipated. I provided 12 minutes of non-face-to-face time during this encounter.  Signed,   Merri Ray, MD [facility] [place].  01/23/19

## 2019-01-23 NOTE — Patient Instructions (Signed)
Good talking today. Glad to hear you are doing much better. Once you are 10 days from infection symptoms, you should be less contagious.  Return to the clinic or go to the nearest emergency room if any of your symptoms worsen or new symptoms occur.   Follow up in 2 weeks for diabetes.

## 2019-01-23 NOTE — Progress Notes (Signed)
Pt is following up for post positive covid. He says he is doing much better and feels totally healed with just a little bit of coughing lingering. He has been monitoring his temp. The highest was 99.6, this morning it was 96.5. Also  excited about the glucose readings he has been receiving. The reading this morning was 171. He states that 3-4 weeks ago, his reading were in the 200's consistently. I went over health maintenance with the pt, he says he will not be taking the pneumonia shot again. That reminder has been discontinued. No refills needed at this time.

## 2019-01-23 NOTE — Telephone Encounter (Signed)
Pt has a virtual appt at 4:00 PM. He is wanting a follow up call from clinical about the wait time/update. Pt requested both numbers on chart be called.  Pt is wanting a call from the manager to complain about patient experience.

## 2019-01-24 ENCOUNTER — Other Ambulatory Visit: Payer: Self-pay | Admitting: Family Medicine

## 2019-01-24 DIAGNOSIS — E118 Type 2 diabetes mellitus with unspecified complications: Secondary | ICD-10-CM

## 2019-01-24 MED ORDER — ONETOUCH VERIO VI STRP: ORAL_STRIP | 3 refills | Status: DC

## 2019-01-24 NOTE — Telephone Encounter (Signed)
[place] test strip  [facility] [place] - [address] [phone] (Phone) [phone] (Fax)   Pt needs refill

## 2019-01-26 ENCOUNTER — Other Ambulatory Visit: Payer: Self-pay | Admitting: Family Medicine

## 2019-01-26 DIAGNOSIS — E118 Type 2 diabetes mellitus with unspecified complications: Secondary | ICD-10-CM

## 2019-01-26 NOTE — Progress Notes (Signed)
f opth

## 2019-02-04 ENCOUNTER — Other Ambulatory Visit: Payer: Self-pay

## 2019-02-04 DIAGNOSIS — Z20822 Contact with and (suspected) exposure to covid-19: Secondary | ICD-10-CM

## 2019-02-06 ENCOUNTER — Other Ambulatory Visit: Payer: Self-pay

## 2019-02-06 DIAGNOSIS — Z20822 Contact with and (suspected) exposure to covid-19: Secondary | ICD-10-CM

## 2019-02-06 LAB — NOVEL CORONAVIRUS, NAA: SARS-CoV-2, NAA: NOT DETECTED

## 2019-02-07 LAB — NOVEL CORONAVIRUS, NAA: SARS-CoV-2, NAA: NOT DETECTED

## 2019-03-21 ENCOUNTER — Encounter: Payer: Self-pay | Admitting: Family Medicine

## 2019-03-21 ENCOUNTER — Ambulatory Visit (INDEPENDENT_AMBULATORY_CARE_PROVIDER_SITE_OTHER): Payer: Medicare Other | Admitting: Family Medicine

## 2019-03-21 ENCOUNTER — Other Ambulatory Visit: Payer: Self-pay

## 2019-03-21 VITALS — BP 138/72 | HR 82 | Temp 97.6°F | Resp 18 | Ht 68.0 in | Wt 177.8 lb

## 2019-03-21 DIAGNOSIS — E1165 Type 2 diabetes mellitus with hyperglycemia: Secondary | ICD-10-CM

## 2019-03-21 DIAGNOSIS — E785 Hyperlipidemia, unspecified: Secondary | ICD-10-CM

## 2019-03-21 NOTE — Progress Notes (Signed)
Subjective:    Patient ID: Randy Mcbride, male    DOB: [birth year], 63 y.o.   MRN: [id number]  HPI Randy Mcbride is a 63 y.o. male Presents today for: Chief Complaint  Patient presents with  . Diabetes    follow up   Last telemedicine visit August 5.  Treated for COVID-19 infection end of July.  Symptoms were improving August 1, felt like symptoms had resolved at August 5 visit. No recurrence of symptoms, no cough/no dyspnea.   Diabetes: Complicated by hyperglycemia, A1c 9.2 in April.  Reported readings in the 200s when discussed August 5 on a telemedicine visit, 141 that day. Metformin 1000 mg daily.   Not currently on ACE or statin. Low-dose Lipitor was recommended in April.  Also recommended increasing metformin to 2000 mg total per day in April. Still only taking 1000mg  QD of metfomin - did not increase.  No home readings.  No new side effects of metformin.   Microalbumin: Normal ratio April 17. Optho, foot exam, pneumovax: Up to date with foot exam today.  Declines flu vaccine.   Diabetic Foot Exam - Simple   Simple Foot Form Diabetic Foot exam was performed with the following findings: Yes 03/21/2019  6:00 PM  Visual Inspection Sensation Testing Pulse Check Comments      Lab Results  Component Value Date   HGBA1C 9.2 (H) 10/05/2018   HGBA1C 7.4 (H) 11/01/2017   HGBA1C 7.0 11/04/2016   Lab Results  Component Value Date   MICROALBUR 0.4 11/13/2014   LDLCALC 109 (H) 10/05/2018   CREATININE 1.02 10/05/2018      Patient Active Problem List   Diagnosis Date Noted  . Glaucoma 07/13/2015  . Vitamin B12 deficiency 07/13/2015  . Diabetes (HCC) 12/25/2013   Past Medical History:  Diagnosis Date  . Cataract   . Diabetes mellitus without complication (HCC)   . Glaucoma    Past Surgical History:  Procedure Laterality Date  . EYE SURGERY    . KNEE ARTHROSCOPY     No Known Allergies Prior to Admission medications   Medication Sig Start Date End  Date Taking? Authorizing Provider  Blood Glucose Monitoring Suppl (GNP EASY TOUCH GLUCOSE METER) DEVI 1 Device by Does not apply route 2 (two) times daily. 07/07/17  Yes McVey, 07/09/17, PA-C  brimonidine (ALPHAGAN) 0.15 % ophthalmic solution 1 drop 3 (three) times daily.   Yes [provider]  dorzolamide-timolol (COSOPT) 22.3-6.8 MG/ML ophthalmic solution Place 1 drop into both eyes 2 (two) times daily.   Yes [provider]  glucose blood (ONETOUCH VERIO) test strip USE 3 TO 4 TIMES DAILY AS DIRECTED BY PHYSICIAN 01/24/19  Yes McVey, 03/26/19, PA-C  latanoprost (XALATAN) 0.005 % ophthalmic solution 1 drop at bedtime.   Yes [provider]  metFORMIN (GLUCOPHAGE XR) 500 MG 24 hr tablet Take 2 tablets (1,000 mg total) by mouth daily with breakfast. 10/05/18  Yes 10/07/18, MD  Multiple Vitamin (MULTIVITAMIN) tablet Take 1 tablet by mouth daily.   Yes [provider]  OVER THE COUNTER MEDICATION    Yes [provider]  tobramycin (TOBREX) 0.3 % ophthalmic solution every 4 (four) hours.   Yes [provider]   Social History   Socioeconomic History  . Marital status: Married    Spouse name: Not on file  . Number of children: Not on file  . Years of education: Not on file  . Highest education level: Not on file  Occupational History  . Not on file  Social Needs  . Financial resource strain: Not on file  . Food insecurity    Worry: Not on file    Inability: Not on file  . Transportation needs    Medical: Not on file    Non-medical: Not on file  Tobacco Use  . Smoking status: Never Smoker  . Smokeless tobacco: Never Used  Substance and Sexual Activity  . Alcohol use: Yes    Alcohol/week: 1.0 standard drinks    Types: 1 Standard drinks or equivalent per week    Comment: drink on weekend  . Drug use: No  . Sexual activity: Never  Lifestyle  . Physical activity    Days per week: Not on file    Minutes per  session: Not on file  . Stress: Not on file  Relationships  . Social Musician on phone: Not on file    Gets together: Not on file    Attends religious service: Not on file    Active member of club or organization: Not on file    Attends meetings of clubs or organizations: Not on file    Relationship status: Not on file  . Intimate partner violence    Fear of current or ex partner: Not on file    Emotionally abused: Not on file    Physically abused: Not on file    Forced sexual activity: Not on file  Other Topics Concern  . Not on file  Social History Narrative  . Not on file   Patient Active Problem List   Diagnosis Date Noted  . Glaucoma 07/13/2015  . Vitamin B12 deficiency 07/13/2015  . Diabetes (HCC) 12/25/2013   Past Medical History:  Diagnosis Date  . Cataract   . Diabetes mellitus without complication (HCC)   . Glaucoma    Past Surgical History:  Procedure Laterality Date  . EYE SURGERY    . KNEE ARTHROSCOPY     No Known Allergies Prior to Admission medications   Medication Sig Start Date End Date Taking? Authorizing Provider  Blood Glucose Monitoring Suppl (GNP EASY TOUCH GLUCOSE METER) DEVI 1 Device by Does not apply route 2 (two) times daily. 07/07/17  Yes McVey, Randy Bhat, PA-C  brimonidine (ALPHAGAN) 0.15 % ophthalmic solution 1 drop 3 (three) times daily.   Yes [provider]  dorzolamide-timolol (COSOPT) 22.3-6.8 MG/ML ophthalmic solution Place 1 drop into both eyes 2 (two) times daily.   Yes [provider]  glucose blood (ONETOUCH VERIO) test strip USE 3 TO 4 TIMES DAILY AS DIRECTED BY PHYSICIAN 01/24/19  Yes McVey, Randy Bhat, PA-C  latanoprost (XALATAN) 0.005 % ophthalmic solution 1 drop at bedtime.   Yes [provider]  metFORMIN (GLUCOPHAGE XR) 500 MG 24 hr tablet Take 2 tablets (1,000 mg total) by mouth daily with breakfast. 10/05/18  Yes Randy Flood, MD  Multiple Vitamin (MULTIVITAMIN) tablet  Take 1 tablet by mouth daily.   Yes [provider]  OVER THE COUNTER MEDICATION    Yes [provider]  tobramycin (TOBREX) 0.3 % ophthalmic solution every 4 (four) hours.   Yes [provider]   Social History   Socioeconomic History  . Marital status: Married    Spouse name: Not on file  . Number of children: Not on file  . Years of education: Not on file  . Highest education level: Not on file  Occupational History  . Not on file  Social Needs  . Financial resource strain: Not on file  . Food insecurity    Worry: Not on file    Inability: Not on file  . Transportation needs    Medical: Not on file    Non-medical: Not on file  Tobacco Use  . Smoking status: Never Smoker  . Smokeless tobacco: Never Used  Substance and Sexual Activity  . Alcohol use: Yes    Alcohol/week: 1.0 standard drinks    Types: 1 Standard drinks or equivalent per week    Comment: drink on weekend  . Drug use: No  . Sexual activity: Never  Lifestyle  . Physical activity    Days per week: Not on file    Minutes per session: Not on file  . Stress: Not on file  Relationships  . Social Herbalist on phone: Not on file    Gets together: Not on file    Attends religious service: Not on file    Active member of club or organization: Not on file    Attends meetings of clubs or organizations: Not on file    Relationship status: Not on file  . Intimate partner violence    Fear of current or ex partner: Not on file    Emotionally abused: Not on file    Physically abused: Not on file    Forced sexual activity: Not on file  Other Topics Concern  . Not on file  Social History Narrative  . Not on file    Review of Systems  Constitutional: Negative for fatigue and unexpected weight change.  Eyes: Negative for visual disturbance.  Respiratory: Negative for cough, chest tightness and shortness of breath.   Cardiovascular: Negative for chest pain, palpitations and  leg swelling.  Gastrointestinal: Negative for abdominal pain and blood in stool.  Neurological: Negative for dizziness, light-headedness and headaches.       Objective:   Physical Exam Vitals signs reviewed.  Constitutional:      Appearance: He is well-developed.  HENT:     Head: Normocephalic and atraumatic.  Eyes:     Pupils: Pupils are equal, round, and reactive to light.  Neck:     Vascular: No carotid bruit or JVD.  Cardiovascular:     Rate and Rhythm: Normal rate and regular rhythm.     Heart sounds: Normal heart sounds. No murmur.  Pulmonary:     Effort: Pulmonary effort is normal.     Breath sounds: Normal breath sounds. No rales.  Skin:    General: Skin is warm and dry.  Neurological:     Mental Status: He is alert and oriented to person, place, and time.    Vitals:   03/21/19 1647 03/21/19 1759  BP: (!) 160/98 138/72  Pulse: 82   Resp: 18   Temp: 97.6 F (36.4 C)   TempSrc: Oral   SpO2: 99%   Weight: 177 lb 12.8 oz (80.6 kg)   Height: 5\' 8"  (1.727 m)         Assessment & Plan:  ALLEX LAPOINT is a 63 y.o. male Type 2 diabetes mellitus with hyperglycemia, without long-term current use of insulin ([place]) - Plan: Comprehensive metabolic panel, Lipid panel, Hemoglobin A1c  -  Repeat A1c, likely will need higher dose of metformin at 1000 mg twice daily.  Changes pending lab results.  Hyperlipidemia, unspecified hyperlipidemia type - Plan: Comprehensive metabolic panel, Lipid panel  -Discussed rationale for statin use with diabetes, and prior LDL  elevation.  -Check lipids, then would consider Lipitor 10 mg daily if still elevated.  Potential side effects and risks discussed.  -Borderline hypertension.  Check outside readings, recheck 6 weeks.  Consider low-dose ACE inhibitor with history of diabetes.  No orders of the defined types were placed in this encounter.  Patient Instructions     Depending on your lab work, I may recommend you increase metformin  to 1000 mg twice per day.  We can wait on the results first.  Additionally if your cholesterol is elevated, I would recommend starting a statin medicine and can send one in at that time as well with repeat labs in 6 weeks.  For blood pressure, Keep a record of your blood pressures outside of the office and if over 140/90 - let me know. Recheck in 6 weeks.   If you have lab work done today you will be contacted with your lab results within the next 2 weeks.  If you have not heard from us then please contact us. The fastest way to get your results is to register for My Chart.   IF you received an x-ray today, you will receive an invoice [facility]. Please contact [facility] at [phone] with questions or concerns regarding your invoice.   IF you received labwork today, you will receive an invoice from [facility]. Please contact [facility] at [phone] with questions or concerns regarding your invoice.   Our billing staff will not be able to assist you with questions regarding bills from these companies.  You will be contacted with the lab results as soon as they are available. The fastest way to get your results is to activate your My Chart account. Instructions are located on the last page of this paperwork. If you have not heard from us regarding the results in 2 weeks, please contact this office.       Signed,   Meredith StaggersJeffrey Kendryck Lacroix, MD [facility].  03/21/19 6:59 PM

## 2019-03-21 NOTE — Patient Instructions (Addendum)
   Depending on your lab work, I may recommend you increase metformin to 1000 mg twice per day.  We can wait on the results first.  Additionally if your cholesterol is elevated, I would recommend starting a statin medicine and can send one in at that time as well with repeat labs in 6 weeks.  For blood pressure, Keep a record of your blood pressures outside of the office and if over 140/90 - let me know. Recheck in 6 weeks.   If you have lab work done today you will be contacted with your lab results within the next 2 weeks.  If you have not heard from [place] then please contact us. The fastest way to get your results is to register for My Chart.   IF you received an x-ray today, you will receive an invoice from [facility]. Please contact [facility] at [phone] with questions or concerns regarding your invoice.   IF you received labwork today, you will receive an invoice from [facility]. Please contact [facility] at [phone] with questions or concerns regarding your invoice.   Our billing staff will not be able to assist you with questions regarding bills from these companies.  You will be contacted with the lab results as soon as they are available. The fastest way to get your results is to activate your My Chart account. Instructions are located on the last page of this paperwork. If you have not heard from [place] regarding the results in 2 weeks, please contact this office.

## 2019-03-22 LAB — COMPREHENSIVE METABOLIC PANEL
ALT: 19 IU/L (ref 0–44)
AST: 22 IU/L (ref 0–40)
Albumin/Globulin Ratio: 1.6 (ref 1.2–2.2)
Albumin: 4.1 g/dL (ref 3.8–4.8)
Alkaline Phosphatase: 76 IU/L (ref 39–117)
BUN/Creatinine Ratio: 12 (ref 10–24)
BUN: 13 mg/dL (ref 8–27)
Bilirubin Total: 0.3 mg/dL (ref 0.0–1.2)
CO2: 24 mmol/L (ref 20–29)
Calcium: 8.7 mg/dL (ref 8.6–10.2)
Chloride: 103 mmol/L (ref 96–106)
Creatinine, Ser: 1.08 mg/dL (ref 0.76–1.27)
GFR calc Af Amer: 83 mL/min/{1.73_m2} (ref >59)
GFR calc non Af Amer: 72 mL/min/{1.73_m2} (ref >59)
Globulin, Total: 2.5 g/dL (ref 1.5–4.5)
Glucose: 274 mg/dL — ABNORMAL HIGH (ref 65–99)
Potassium: 4.8 mmol/L (ref 3.5–5.2)
Sodium: 137 mmol/L (ref 134–144)
Total Protein: 6.6 g/dL (ref 6.0–8.5)

## 2019-03-22 LAB — LIPID PANEL
Chol/HDL Ratio: 3.8 ratio (ref 0.0–5.0)
Cholesterol, Total: 185 mg/dL (ref 100–199)
HDL: 49 mg/dL (ref >39)
LDL Chol Calc (NIH): 116 mg/dL — ABNORMAL HIGH (ref 0–99)
Triglycerides: 113 mg/dL (ref 0–149)
VLDL Cholesterol Cal: 20 mg/dL (ref 5–40)

## 2019-03-22 LAB — HEMOGLOBIN A1C
Est. average glucose Bld gHb Est-mCnc: 203 mg/dL
Hgb A1c MFr Bld: 8.7 % — ABNORMAL HIGH (ref 4.8–5.6)

## 2019-03-31 ENCOUNTER — Other Ambulatory Visit: Payer: Self-pay | Admitting: Family Medicine

## 2019-03-31 DIAGNOSIS — E785 Hyperlipidemia, unspecified: Secondary | ICD-10-CM

## 2019-03-31 DIAGNOSIS — E118 Type 2 diabetes mellitus with unspecified complications: Secondary | ICD-10-CM

## 2019-03-31 MED ORDER — ATORVASTATIN CALCIUM 10 MG PO TABS: 10.0000 mg | ORAL_TABLET | Freq: Every day | ORAL | 1 refills | Status: DC

## 2019-03-31 MED ORDER — METFORMIN HCL 1000 MG PO TABS: 1000.0000 mg | ORAL_TABLET | Freq: Two times a day (BID) | ORAL | 1 refills | Status: DC

## 2019-03-31 NOTE — Progress Notes (Signed)
See labs 

## 2019-05-02 ENCOUNTER — Encounter: Payer: Self-pay | Admitting: Family Medicine

## 2019-05-02 ENCOUNTER — Ambulatory Visit (INDEPENDENT_AMBULATORY_CARE_PROVIDER_SITE_OTHER): Payer: Medicare Other | Admitting: Family Medicine

## 2019-05-02 ENCOUNTER — Other Ambulatory Visit: Payer: Self-pay

## 2019-05-02 VITALS — BP 128/82 | HR 72 | Temp 98.0°F | Wt 179.6 lb

## 2019-05-02 DIAGNOSIS — Z23 Encounter for immunization: Secondary | ICD-10-CM

## 2019-05-02 DIAGNOSIS — E7800 Pure hypercholesterolemia, unspecified: Secondary | ICD-10-CM | POA: Diagnosis not present

## 2019-05-02 DIAGNOSIS — E1165 Type 2 diabetes mellitus with hyperglycemia: Secondary | ICD-10-CM | POA: Diagnosis not present

## 2019-05-02 NOTE — Patient Instructions (Addendum)
   Ok to try lipitor once per week for now - can be increased up to once per day if tolerated. Recheck visit with labs in 6 weeks.   If you have lab work done today you will be contacted with your lab results within the next 2 weeks.  If you have not heard from [place] then please contact us. The fastest way to get your results is to register for My Chart.   IF you received an x-ray today, you will receive an invoice from [facility]. Please contact [facility] at [phone] with questions or concerns regarding your invoice.   IF you received labwork today, you will receive an invoice from [place]. Please contact [facility] at [phone] with questions or concerns regarding your invoice.   Our billing staff will not be able to assist you with questions regarding bills from these companies.  You will be contacted with the lab results as soon as they are available. The fastest way to get your results is to activate your My Chart account. Instructions are located on the last page of this paperwork. If you have not heard from [place] regarding the results in 2 weeks, please contact this office.

## 2019-05-02 NOTE — Progress Notes (Signed)
Subjective:  Patient ID: Randy Mcbride, male    DOB: [birth year]  Age: 63 y.o. MRN: [id number]  CC:  Chief Complaint  Patient presents with  . Medical Management of Chronic Issues     6 week f/u on chronic medical condition. Patient also need a flu shot. Patient pick up the cholesterol med and decided he do not want to take this rx after reading up on the side effects. He would like to take and natural way    HPI Randy Mcbride presents for   Diabetes: Complicated by hyperglycemia.  Last visit in October.  Statin discussed at that time given history of diabetes, elevated LDL, and elevated ASCVD risk score.  Lipitor was prescribed, but see above.  Patient initially declined use due to concern of side effects. Plans on diet control or organic, but after further discussion will try once per week initially.  Elevated A1c, metformin increased to 1000 mg twice daily. Doing ok. Diarrhea once per week only.    Microalbumin: Normal ratio April 17.  Optho, foot exam, pneumovax:  optho exam 1 week ago - told had good report.  Due for flu vaccine: received today.   Lab Results  Component Value Date   HGBA1C 8.7 (H) 03/21/2019   HGBA1C 9.2 (H) 10/05/2018   HGBA1C 7.4 (H) 11/01/2017   Lab Results  Component Value Date   MICROALBUR 0.4 11/13/2014   LDLCALC 116 (H) 03/21/2019   CREATININE 1.08 03/21/2019   The 10-year ASCVD risk score Denman George(Goff DC Jr., et al., 2013) is: 22.4%   Values used to calculate the score:     Age: [age] years     Sex: Male     Is Non-Hispanic African American: Yes     Diabetic: Yes     Tobacco smoker: No     Systolic Blood Pressure: 148 mmHg     Is BP treated: No     HDL Cholesterol: 49 mg/dL     Total Cholesterol: 185 mg/dL     History Patient Active Problem List   Diagnosis Date Noted  . Glaucoma 07/13/2015  . Vitamin B12 deficiency 07/13/2015  . Diabetes (HCC) 12/25/2013   Past Medical History:  Diagnosis Date  . Cataract   . Diabetes mellitus  without complication (HCC)   . Glaucoma    Past Surgical History:  Procedure Laterality Date  . EYE SURGERY    . KNEE ARTHROSCOPY     No Known Allergies Prior to Admission medications   Medication Sig Start Date End Date Taking? Authorizing Provider  Blood Glucose Monitoring Suppl (GNP EASY TOUCH GLUCOSE METER) DEVI 1 Device by Does not apply route 2 (two) times daily. 07/07/17  Yes McVey, Madelaine BhatElizabeth Whitney, PA-C  brimonidine (ALPHAGAN) 0.15 % ophthalmic solution 1 drop 3 (three) times daily.   Yes [provider]  dorzolamide-timolol (COSOPT) 22.3-6.8 MG/ML ophthalmic solution Place 1 drop into both eyes 2 (two) times daily.   Yes [provider]  glucose blood (ONETOUCH VERIO) test strip USE 3 TO 4 TIMES DAILY AS DIRECTED BY PHYSICIAN 01/24/19  Yes McVey, Madelaine BhatElizabeth Whitney, PA-C  latanoprost (XALATAN) 0.005 % ophthalmic solution 1 drop at bedtime.   Yes [provider]  metFORMIN (GLUCOPHAGE) 1000 MG tablet Take 1 tablet (1,000 mg total) by mouth 2 (two) times daily with a meal. 03/31/19  Yes Shade FloodGreene, Dontasia Miranda R, MD  Multiple Vitamin (MULTIVITAMIN) tablet Take 1 tablet by mouth daily.   Yes [provider]  OVER THE COUNTER  MEDICATION    Yes [provider]  tobramycin (TOBREX) 0.3 % ophthalmic solution every 4 (four) hours.   Yes [provider]  atorvastatin (LIPITOR) 10 MG tablet Take 1 tablet (10 mg total) by mouth daily. Patient not taking: Reported on 05/02/2019 03/31/19   Wendie Agreste, MD   Social History   Socioeconomic History  . Marital status: Married    Spouse name: Not on file  . Number of children: Not on file  . Years of education: Not on file  . Highest education level: Not on file  Occupational History  . Not on file  Social Needs  . Financial resource strain: Not on file  . Food insecurity    Worry: Not on file    Inability: Not on file  . Transportation needs    Medical: Not on file    Non-medical: Not  on file  Tobacco Use  . Smoking status: Never Smoker  . Smokeless tobacco: Never Used  Substance and Sexual Activity  . Alcohol use: Yes    Alcohol/week: 1.0 standard drinks    Types: 1 Standard drinks or equivalent per week    Comment: drink on weekend  . Drug use: No  . Sexual activity: Never  Lifestyle  . Physical activity    Days per week: Not on file    Minutes per session: Not on file  . Stress: Not on file  Relationships  . Social Herbalist on phone: Not on file    Gets together: Not on file    Attends religious service: Not on file    Active member of club or organization: Not on file    Attends meetings of clubs or organizations: Not on file    Relationship status: Not on file  . Intimate partner violence    Fear of current or ex partner: Not on file    Emotionally abused: Not on file    Physically abused: Not on file    Forced sexual activity: Not on file  Other Topics Concern  . Not on file  Social History Narrative  . Not on file    Review of Systems  Constitutional: Negative for fatigue and unexpected weight change.  Eyes: Negative for visual disturbance (followed by optho. no acute changes. ).  Respiratory: Negative for cough, chest tightness and shortness of breath.   Cardiovascular: Negative for chest pain, palpitations and leg swelling.  Gastrointestinal: Negative for abdominal pain and blood in stool.  Neurological: Negative for dizziness, light-headedness and headaches.     Objective:   Vitals:   05/02/19 0926  BP: (!) 148/79  Pulse: 72  Temp: 98 F (36.7 C)  TempSrc: Oral  SpO2: 98%  Weight: 179 lb 9.6 oz (81.5 kg)     Physical Exam Vitals signs reviewed.  Constitutional:      Appearance: He is well-developed.  HENT:     Head: Normocephalic and atraumatic.  Eyes:     Pupils: Pupils are equal, round, and reactive to light.  Neck:     Vascular: No carotid bruit or JVD.  Cardiovascular:     Rate and Rhythm: Normal rate  and regular rhythm.     Heart sounds: Normal heart sounds. No murmur.  Pulmonary:     Effort: Pulmonary effort is normal.     Breath sounds: Normal breath sounds. No rales.  Skin:    General: Skin is warm and dry.  Neurological:     Mental Status: He  is alert and oriented to person, place, and time.        Assessment & Plan:  Randy Mcbride is a 63 y.o. male . Elevated LDL cholesterol level  -Mild elevation, but increased ASCVD risk score as well as diabetes.  Rationale for use of statins and potential risks versus benefits were discussed.  After this discussion he did agree to try it initially once per week, option of increased dosing if tolerated.  Recheck labs in 6 weeks.  Need for prophylactic vaccination and inoculation against influenza - Plan: Flu Vaccine QUAD High Dose(Fluad)  Type 2 diabetes mellitus with hyperglycemia, without long-term current use of insulin (HCC)  -Overall tolerating higher dose of Metformin.  If increased frequency of diarrhea would change to other agent/decrease metformin dosing.   No orders of the defined types were placed in this encounter.  Patient Instructions       If you have lab work done today you will be contacted with your lab results within the next 2 weeks.  If you have not heard from [place] then please contact us. The fastest way to get your results is to register for My Chart.   IF you received an x-ray today, you will receive an invoice from [facility]. Please contact [facility] at [phone] with questions or concerns regarding your invoice.   IF you received labwork today, you will receive an invoice from [place]. Please contact [facility] at [phone] with questions or concerns regarding your invoice.   Our billing staff will not be able to assist you with questions regarding bills from these companies.  You will be contacted with the lab results as soon as they are available. The fastest way to get your  results is to activate your My Chart account. Instructions are located on the last page of this paperwork. If you have not heard from [place] regarding the results in 2 weeks, please contact this office.          Signed, Meredith Staggers, MD [facility]

## 2019-06-07 ENCOUNTER — Telehealth (INDEPENDENT_AMBULATORY_CARE_PROVIDER_SITE_OTHER): Payer: Medicare Other | Admitting: Family Medicine

## 2019-06-07 ENCOUNTER — Telehealth: Payer: Self-pay | Admitting: Family Medicine

## 2019-06-07 ENCOUNTER — Encounter: Payer: Self-pay | Admitting: Family Medicine

## 2019-06-07 ENCOUNTER — Other Ambulatory Visit: Payer: Self-pay

## 2019-06-07 DIAGNOSIS — E1165 Type 2 diabetes mellitus with hyperglycemia: Secondary | ICD-10-CM | POA: Diagnosis not present

## 2019-06-07 NOTE — Telephone Encounter (Signed)
06/07/2019 - PATIENT HAD A TELEMED VISIT WITH DR. Carlota Raspberry ON Friday (06/07/2019). DR. Carlota Raspberry HAS REQUESTED HE HAVE AN IN-OFFICE FOLLOW-UP IN 1 MONTH TO RECHECK HIS DIABETES AND CHOLESTEROL. I TRIED TO CALL AND SCHEDULE BUT HAD TO LEAVE A MESSAGE ON HIS VOICE MAIL TO RETURN MY CALL. [place]

## 2019-06-07 NOTE — Patient Instructions (Addendum)
  Start Lipitor once per week for now and if tolerated can increase to few times per week although up to once per day if you are still tolerating that well.  We can check your liver test and recheck cholesterol test at follow-up visit in 1 month as well as diabetes test at that time.  Bring a copy of your fasting and 2-hour after meal blood sugar readings to that visit.  Continue metformin same dose for now.   If you have lab work done today you will be contacted with your lab results within the next 2 weeks.  If you have not heard from [place] then please contact us. The fastest way to get your results is to register for My Chart.   IF you received an x-ray today, you will receive an invoice from [facility]. Please contact [facility] at [phone] with questions or concerns regarding your invoice.   IF you received labwork today, you will receive an invoice from [place]. Please contact [facility] at [phone] with questions or concerns regarding your invoice.   Our billing staff will not be able to assist you with questions regarding bills from these companies.  You will be contacted with the lab results as soon as they are available. The fastest way to get your results is to activate your My Chart account. Instructions are located on the last page of this paperwork. If you have not heard from [place] regarding the results in 2 weeks, please contact this office.

## 2019-06-07 NOTE — Progress Notes (Signed)
Virtual Visit via Telephone Note  I connected with Randy Mcbride on 06/08/19 at 5:22 PM by telephone and verified that I am speaking with the correct person using two identifiers.   I discussed the limitations, risks, security and privacy concerns of performing an evaluation and management service by telephone and the availability of in person appointments. I also discussed with the patient that there may be a patient responsible charge related to this service. The patient expressed understanding and agreed to proceed, consent obtained  Chief complaint:  DM, HLD.   History of Present Illness: Randy Mcbride is a 63 y.o. male  Diabetes: Associated with hyperglycemia. Increase metformin to 1000 mg twice per day in October. recommended Lipitor 10 mg daily in October. Did not take. Concerned about possible side effects.  Will try once per week, then increase if tolerated  Rare diarrhea - certain foods. tolerating metfomrin  Microalbumin: Normal ratio October 05, 2018 Optho, foot exam, pneumovax: Up-to-date. Fasting readings:160, 200 this am. Usually under 200. Low 120.   Postprandial: 160, 140's.   Lab Results  Component Value Date   HGBA1C 8.7 (H) 03/21/2019   HGBA1C 9.2 (H) 10/05/2018   HGBA1C 7.4 (H) 11/01/2017   Lab Results  Component Value Date   MICROALBUR 0.4 11/13/2014   LDLCALC 116 (H) 03/21/2019   CREATININE 1.08 03/21/2019      Patient Active Problem List   Diagnosis Date Noted  . Glaucoma 07/13/2015  . Vitamin B12 deficiency 07/13/2015  . Diabetes ([place]) 12/25/2013   Past Medical History:  Diagnosis Date  . Cataract   . Diabetes mellitus without complication ([place])   . Glaucoma    Past Surgical History:  Procedure Laterality Date  . EYE SURGERY    . KNEE ARTHROSCOPY     No Known Allergies Prior to Admission medications   Medication Sig Start Date End Date Taking? Authorizing Provider  Blood Glucose Monitoring Suppl (GNP EASY TOUCH GLUCOSE  METER) DEVI 1 Device by Does not apply route 2 (two) times daily. 07/07/17  Yes McVey, Gelene Mink, PA-C  brimonidine (ALPHAGAN) 0.15 % ophthalmic solution 1 drop 3 (three) times daily.   Yes [provider]  dorzolamide-timolol (COSOPT) 22.3-6.8 MG/ML ophthalmic solution Place 1 drop into both eyes 2 (two) times daily.   Yes [provider]  glucose blood (ONETOUCH VERIO) test strip USE 3 TO 4 TIMES DAILY AS DIRECTED BY PHYSICIAN 01/24/19  Yes McVey, Gelene Mink, PA-C  latanoprost (XALATAN) 0.005 % ophthalmic solution 1 drop at bedtime.   Yes [provider]  metFORMIN (GLUCOPHAGE) 1000 MG tablet Take 1 tablet (1,000 mg total) by mouth 2 (two) times daily with a meal. 03/31/19  Yes Wendie Agreste, MD  Multiple Vitamin (MULTIVITAMIN) tablet Take 1 tablet by mouth daily.   Yes [provider]  OVER THE COUNTER MEDICATION    Yes [provider]  tobramycin (TOBREX) 0.3 % ophthalmic solution every 4 (four) hours.   Yes [provider]  atorvastatin (LIPITOR) 10 MG tablet Take 1 tablet (10 mg total) by mouth daily. Patient not taking: Reported on 05/02/2019 03/31/19   Wendie Agreste, MD  vitamin B-12 (CYANOCOBALAMIN) 500 MCG tablet Take by mouth.    [provider]   Social History   Socioeconomic History  . Marital status: Married    Spouse name: Not on file  . Number of children: Not on file  . Years of education: Not on file  . Highest education level:  Not on file  Occupational History  . Not on file  Tobacco Use  . Smoking status: Never Smoker  . Smokeless tobacco: Never Used  Substance and Sexual Activity  . Alcohol use: Yes    Alcohol/week: 1.0 standard drinks    Types: 1 Standard drinks or equivalent per week    Comment: drink on weekend  . Drug use: No  . Sexual activity: Never  Other Topics Concern  . Not on file  Social History Narrative  . Not on file   Social Determinants of Health    Financial Resource Strain:   . Difficulty of Paying Living Expenses: Not on file  Food Insecurity:   . Worried About Programme researcher, broadcasting/film/video in the Last Year: Not on file  . Ran Out of Food in the Last Year: Not on file  Transportation Needs:   . Lack of Transportation (Medical): Not on file  . Lack of Transportation (Non-Medical): Not on file  Physical Activity:   . Days of Exercise per Week: Not on file  . Minutes of Exercise per Session: Not on file  Stress:   . Feeling of Stress : Not on file  Social Connections:   . Frequency of Communication with Friends and Family: Not on file  . Frequency of Social Gatherings with Friends and Family: Not on file  . Attends Religious Services: Not on file  . Active Member of Clubs or Organizations: Not on file  . Attends Banker Meetings: Not on file  . Marital Status: Not on file  Intimate Partner Violence:   . Fear of Current or Ex-Partner: Not on file  . Emotionally Abused: Not on file  . Physically Abused: Not on file  . Sexually Abused: Not on file     Observations/Objective:  There were no vitals filed for this visit. No distress, euthymic mood, appropriate responses. all questions answered.   Assessment and Plan: Type 2 diabetes mellitus with hyperglycemia, without long-term current use of insulin (HCC)  - stable home readings. Continue same regimen, start lipitor - initially will try intermittent dosing. Recheck 1 month for repeat labs. Home readings to be reviewed at that time.   Follow Up Instructions:   1 month.   Patient Instructions    Start Lipitor once per week for now and if tolerated can increase to few times per week although up to once per day if you are still tolerating that well.  We can check your liver test and recheck cholesterol test at follow-up visit in 1 month as well as diabetes test at that time.  Bring a copy of your fasting and 2-hour after meal blood sugar readings to that visit.   Continue metformin same dose for now.   If you have lab work done today you will be contacted with your lab results within the next 2 weeks.  If you have not heard from [place] then please contact us. The fastest way to get your results is to register for My Chart.   IF you received an x-ray today, you will receive an invoice from [facility]. Please contact [facility] at [phone] with questions or concerns regarding your invoice.   IF you received labwork today, you will receive an invoice from [place]. Please contact [facility] at [phone] with questions or concerns regarding your invoice.   Our billing staff will not be able to assist you with questions regarding bills from these companies.  You will be contacted with the lab results  as soon as they are available. The fastest way to get your results is to activate your My Chart account. Instructions are located on the last page of this paperwork. If you have not heard from us regarding the results in 2 weeks, please contact this office.        I discussed the assessment and treatment plan with the patient. The patient was provided an opportunity to ask questions and all were answered. The patient agreed with the plan and demonstrated an understanding of the instructions.   The patient was advised to call back or seek an in-person evaluation if the symptoms worsen or if the condition fails to improve as anticipated.  I provided 8 minutes of non-face-to-face time during this encounter.  Signed,   Meredith StaggersJeffrey Selassie Spatafore, MD Primary Care at [facility].  06/08/19

## 2019-06-07 NOTE — Progress Notes (Signed)
DM and Lipid check. Med refills- metformin & Blood sugar supplies (lancets and blood strips) Last bloodwork was in October.

## 2019-08-10 ENCOUNTER — Ambulatory Visit: Payer: Medicare Other | Attending: Internal Medicine

## 2019-08-10 DIAGNOSIS — Z23 Encounter for immunization: Secondary | ICD-10-CM | POA: Insufficient documentation

## 2019-08-10 NOTE — Progress Notes (Signed)
   Covid-19 Vaccination Clinic  Name:  Randy Mcbride    MRN: [id number] DOB: [birth year]  08/10/2019  [name] was observed post Covid-19 immunization for 15 minutes without incidence. He was provided with Vaccine Information Sheet and instruction to access the V-Safe system.   [name] was instructed to call 911 with any severe reactions post vaccine: Marland Kitchen Difficulty breathing  . Swelling of your face and throat  . A fast heartbeat  . A bad rash all over your body  . Dizziness and weakness    Immunizations Administered    Name Date Dose VIS Date Route   Pfizer COVID-19 Vaccine 08/10/2019 12:14 PM 0.3 mL 05/31/2019 Intramuscular   Manufacturer: [employer], [employer]   Lot: [id number]   NDC: [address]-4

## 2019-08-12 ENCOUNTER — Ambulatory Visit: Payer: Medicare Other

## 2019-08-19 ENCOUNTER — Encounter: Payer: Medicare Other | Admitting: Family Medicine

## 2019-09-03 ENCOUNTER — Ambulatory Visit: Payer: Medicare Other | Attending: Internal Medicine

## 2019-09-03 DIAGNOSIS — Z23 Encounter for immunization: Secondary | ICD-10-CM

## 2019-09-03 NOTE — Progress Notes (Signed)
   Covid-19 Vaccination Clinic  Name:  Randy Mcbride    MRN: [id number] DOB: 09/30/1955  09/03/2019  [name] was observed post Covid-19 immunization for 15 minutes without incident. He was provided with Vaccine Information Sheet and instruction to access the V-Safe system.   Mr. Marrow was instructed to call 911 with any severe reactions post vaccine: Marland Kitchen Difficulty breathing  . Swelling of face and throat  . A fast heartbeat  . A bad rash all over body  . Dizziness and weakness   Immunizations Administered    Name Date Dose VIS Date Route   Pfizer COVID-19 Vaccine 09/03/2019 11:36 AM 0.3 mL 05/31/2019 Intramuscular   Manufacturer: [employer], [employer]   Lot: [id number]   NDC: [address]-0

## 2019-09-25 ENCOUNTER — Encounter: Payer: Medicare Other | Admitting: Family Medicine

## 2019-09-27 ENCOUNTER — Encounter: Payer: Self-pay | Admitting: Family Medicine

## 2019-10-12 ENCOUNTER — Other Ambulatory Visit: Payer: Self-pay | Admitting: Family Medicine

## 2019-10-12 DIAGNOSIS — E118 Type 2 diabetes mellitus with unspecified complications: Secondary | ICD-10-CM

## 2019-10-12 NOTE — Telephone Encounter (Signed)
Requested Prescriptions  Pending Prescriptions Disp Refills  . metFORMIN (GLUCOPHAGE) 1000 MG tablet [Pharmacy Med Name: metFORMIN HCl 1000 MG Oral Tablet] 180 tablet 0    Sig: TAKE 1 TABLET BY MOUTH TWICE DAILY WITH A MEAL.     Endocrinology:  Diabetes - Biguanides Failed - 10/12/2019  4:54 PM      Failed - HBA1C is between 0 and 7.9 and within 180 days    Hgb A1c MFr Bld  Date Value Ref Range Status  03/21/2019 8.7 (H) 4.8 - 5.6 % Final    Comment:             Prediabetes: 5.7 - 6.4          Diabetes: >6.4          Glycemic control for adults with diabetes: <7.0          Passed - Cr in normal range and within 360 days    Creat  Date Value Ref Range Status  07/13/2015 1.00 0.70 - 1.25 mg/dL Final   Creatinine, Ser  Date Value Ref Range Status  03/21/2019 1.08 0.76 - 1.27 mg/dL Final         Passed - eGFR in normal range and within 360 days    GFR calc Af Amer  Date Value Ref Range Status  03/21/2019 83 >59 mL/min/1.73 Final   GFR calc non Af Amer  Date Value Ref Range Status  03/21/2019 72 >59 mL/min/1.73 Final         Passed - Valid encounter within last 6 months    Recent Outpatient Visits          4 months ago Type 2 diabetes mellitus with hyperglycemia, without long-term current use of insulin [facility])   [facility] [place] [facility], Ranell Patrick, MD   5 months ago Elevated LDL cholesterol level   Primary Care at [place], MD   6 months ago Type 2 diabetes mellitus with hyperglycemia, without long-term current use of insulin [facility])   Primary Care at [place], MD   8 months ago COVID-19 virus infection   [facility] at [facility], Ranell Patrick, MD   1 year ago Elevated LDL cholesterol level   [facility] at [facility], Ranell Patrick, MD

## 2020-02-06 ENCOUNTER — Other Ambulatory Visit: Payer: Self-pay

## 2020-02-06 ENCOUNTER — Ambulatory Visit (INDEPENDENT_AMBULATORY_CARE_PROVIDER_SITE_OTHER): Payer: Medicare Other | Admitting: Family Medicine

## 2020-02-06 ENCOUNTER — Encounter: Payer: Self-pay | Admitting: Family Medicine

## 2020-02-06 VITALS — BP 147/92 | HR 82 | Temp 97.7°F | Ht 68.0 in | Wt 172.0 lb

## 2020-02-06 DIAGNOSIS — E118 Type 2 diabetes mellitus with unspecified complications: Secondary | ICD-10-CM | POA: Diagnosis not present

## 2020-02-06 DIAGNOSIS — E1165 Type 2 diabetes mellitus with hyperglycemia: Secondary | ICD-10-CM

## 2020-02-06 MED ORDER — LANCETS 30G MISC: 3 refills | Status: AC

## 2020-02-06 MED ORDER — ONETOUCH VERIO VI STRP: ORAL_STRIP | 3 refills | Status: DC

## 2020-02-06 MED ORDER — METFORMIN HCL 1000 MG PO TABS: 1000.0000 mg | ORAL_TABLET | Freq: Two times a day (BID) | ORAL | 3 refills | Status: AC

## 2020-02-06 NOTE — Patient Instructions (Addendum)
  We will let you know the results of your labs.  Your repeat blood pressure is more satisfactory, though the upper numbers still high.  I recommend that you get your blood pressure checked elsewhere every several weeks.  If it is running greater than 140/90 come in sooner so that we can decide on treatment.  Try to watch your diet.  I recommend looking online at the diabetes.org website which is the site of the American diabetic Association and has a lot of good information on diabetes.  Continue the Metformin 1000 mg twice daily.  If your hemoglobin A1c on today's blood work comes back too high we might need to increase or add medication to you.  Return in about 4 months to see Dr. Silas [place] for a follow-up visit.  It is important that you follow-up with your eye doctor here annually.     If you have lab work done today you will be contacted with your lab results within the next 2 weeks.  If you have not heard from [place] then please contact us. The fastest way to get your results is to register for My Chart.   IF you received an x-ray today, you will receive an invoice from [facility]. Please contact [facility] at [phone] with questions or concerns regarding your invoice.   IF you received labwork today, you will receive an invoice from [place]. Please contact [facility] at [phone] with questions or concerns regarding your invoice.   Our billing staff will not be able to assist you with questions regarding bills from these companies.  You will be contacted with the lab results as soon as they are available. The fastest way to get your results is to activate your My Chart account. Instructions are located on the last page of this paperwork. If you have not heard from [place] regarding the results in 2 weeks, please contact this office.

## 2020-02-06 NOTE — Progress Notes (Signed)
Patient ID: Randy Mcbride, male    DOB: 1956/03/21  Age: 64 y.o. MRN: [id number]  Chief Complaint  Patient presents with  . Diabetes    follow up     Subjective:  Pleasant gentleman here for a routine diabetic follow-up.  He needs some prescriptions and test done.  No major complaints.  He has not been having any headaches or dizziness or chest pains or shortness of breath or neurologic symptoms.  He had to cancel an eye appointment but he is going to reschedule that.  Current allergies, medications, problem list, past/family and social histories reviewed.  Objective:  BP (!) 147/92   Pulse 82   Temp 97.7 F (36.5 C)   Ht '5\' 8"'  (1.727 m)   Wt 172 lb (78 kg)   SpO2 99%   BMI 26.15 kg/m  146/77  Healthy-appearing man in no acute distress.  TMs normal.  Throat clear.  Neck supple without nodes.  Chest clear.  Heart rate without murmurs.  Abdomen soft without masses or tenderness.  Extremities normal.  No diabetic ulcerations.  Assessment & Plan:   Assessment: 1. Type 2 diabetes mellitus with hyperglycemia, without long-term current use of insulin ([place])   2. Type 2 diabetes mellitus with complication, without long-term current use of insulin ([place])       Plan: See instructions  Orders Placed This Encounter  Procedures  . Microalbumin / creatinine urine ratio  . Lipid panel  . CMP14+EGFR  . Hemoglobin A1c    Meds ordered this encounter  Medications  . glucose blood (ONETOUCH VERIO) test strip    Sig: USE 3 TO 4 TIMES DAILY AS DIRECTED BY PHYSICIAN    Dispense:  200 each    Refill:  3    Please consider 90 day supplies to promote better adherence  . metFORMIN (GLUCOPHAGE) 1000 MG tablet    Sig: Take 1 tablet (1,000 mg total) by mouth 2 (two) times daily with a meal.    Dispense:  180 tablet    Refill:  3  . Lancets 30G MISC    Sig: Use as needed for diabetic checks    Dispense:  200 each    Refill:  3         Patient Instructions    We will let you  know the results of your labs.  Your repeat blood pressure is more satisfactory, though the upper numbers still high.  I recommend that you get your blood pressure checked elsewhere every several weeks.  If it is running greater than 140/90 come in sooner so that we can decide on treatment.  Try to watch your diet.  I recommend looking online at the diabetes.org website which is the site of the American diabetic Association and has a lot of good information on diabetes.  Continue the Metformin 1000 mg twice daily.  If your hemoglobin A1c on today's blood work comes back too high we might need to increase or add medication to you.  Return in about 4 months to see Dr. Janeann Forehand for a follow-up visit.  It is important that you follow-up with your eye doctor here annually.     If you have lab work done today you will be contacted with your lab results within the next 2 weeks.  If you have not heard from [place] then please contact us. The fastest way to get your results is to register for My Chart.   IF you received an x-ray today, you  will receive an invoice from [facility]. Please contact [facility] at [phone] with questions or concerns regarding your invoice.   IF you received labwork today, you will receive an invoice from [facility]. Please contact [facility] at [phone] with questions or concerns regarding your invoice.   Our billing staff will not be able to assist you with questions regarding bills from these companies.  You will be contacted with the lab results as soon as they are available. The fastest way to get your results is to activate your My Chart account. Instructions are located on the last page of this paperwork. If you have not heard from [place] regarding the results in 2 weeks, please contact this office.         No follow-ups on file.   Ruben Reason, MD 02/06/2020

## 2020-02-07 LAB — LIPID PANEL
Chol/HDL Ratio: 3.4 ratio (ref 0.0–5.0)
Cholesterol, Total: 161 mg/dL (ref 100–199)
HDL: 47 mg/dL (ref >39)
LDL Chol Calc (NIH): 102 mg/dL — ABNORMAL HIGH (ref 0–99)
Triglycerides: 62 mg/dL (ref 0–149)
VLDL Cholesterol Cal: 12 mg/dL (ref 5–40)

## 2020-02-07 LAB — CMP14+EGFR
ALT: 19 IU/L (ref 0–44)
AST: 21 IU/L (ref 0–40)
Albumin/Globulin Ratio: 2 (ref 1.2–2.2)
Albumin: 4.3 g/dL (ref 3.8–4.8)
Alkaline Phosphatase: 63 IU/L (ref 48–121)
BUN/Creatinine Ratio: 15 (ref 10–24)
BUN: 13 mg/dL (ref 8–27)
Bilirubin Total: 0.4 mg/dL (ref 0.0–1.2)
CO2: 26 mmol/L (ref 20–29)
Calcium: 9 mg/dL (ref 8.6–10.2)
Chloride: 103 mmol/L (ref 96–106)
Creatinine, Ser: 0.87 mg/dL (ref 0.76–1.27)
GFR calc Af Amer: 104 mL/min/{1.73_m2} (ref >59)
GFR calc non Af Amer: 90 mL/min/{1.73_m2} (ref >59)
Globulin, Total: 2.2 g/dL (ref 1.5–4.5)
Glucose: 123 mg/dL — ABNORMAL HIGH (ref 65–99)
Potassium: 4.2 mmol/L (ref 3.5–5.2)
Sodium: 139 mmol/L (ref 134–144)
Total Protein: 6.5 g/dL (ref 6.0–8.5)

## 2020-02-07 LAB — MICROALBUMIN / CREATININE URINE RATIO
Creatinine, Urine: 119.4 mg/dL
Microalb/Creat Ratio: <3 mg/g creat (ref 0–29)
Microalbumin, Urine: <3 ug/mL

## 2020-02-07 LAB — HEMOGLOBIN A1C
Est. average glucose Bld gHb Est-mCnc: 171 mg/dL
Hgb A1c MFr Bld: 7.6 % — ABNORMAL HIGH (ref 4.8–5.6)

## 2020-02-07 NOTE — Progress Notes (Signed)
Your hemoglobin A1c shows that your diabetes is in improving control.  It is down from 8.7-7.6.  It would be good if we can get it on down into the range of 7.  Continue current care.Sandria Bales. Alwyn Ren, MD

## 2020-05-13 ENCOUNTER — Telehealth: Payer: Self-pay | Admitting: Family Medicine

## 2020-05-13 NOTE — Telephone Encounter (Signed)
Called pt and pt contact answered stated if he will give our office a call to resch his appt on 12/20 due to provider being out of the office she hung up.

## 2020-06-08 ENCOUNTER — Ambulatory Visit: Payer: Medicare Other | Admitting: Family Medicine

## 2020-08-19 ENCOUNTER — Other Ambulatory Visit: Payer: Self-pay | Admitting: Family Medicine

## 2020-08-19 DIAGNOSIS — E785 Hyperlipidemia, unspecified: Secondary | ICD-10-CM

## 2020-08-19 NOTE — Telephone Encounter (Signed)
  Notes to clinic:  this script is expired  Review for continue use and refill    Requested Prescriptions  Pending Prescriptions Disp Refills   atorvastatin (LIPITOR) 10 MG tablet [Pharmacy Med Name: Atorvastatin Calcium 10 MG Oral Tablet] 90 tablet 0    Sig: Take 1 tablet by mouth once daily      Cardiovascular:  Antilipid - Statins Failed - 08/19/2020 11:26 AM      Failed - LDL in normal range and within 360 days    LDL Chol Calc (NIH)  Date Value Ref Range Status  02/06/2020 102 (H) 0 - 99 mg/dL Final          Passed - Total Cholesterol in normal range and within 360 days    Cholesterol, Total  Date Value Ref Range Status  02/06/2020 161 100 - 199 mg/dL Final          Passed - HDL in normal range and within 360 days    HDL  Date Value Ref Range Status  02/06/2020 47 >39 mg/dL Final          Passed - Triglycerides in normal range and within 360 days    Triglycerides  Date Value Ref Range Status  02/06/2020 62 0 - 149 mg/dL Final          Passed - Patient is not pregnant      Passed - Valid encounter within last 12 months    Recent Outpatient Visits           6 months ago Type 2 diabetes mellitus with hyperglycemia, without long-term current use of insulin (HCC)   Primary Care at [facility], Sandria Bales, MD   1 year ago Type 2 diabetes mellitus with hyperglycemia, without long-term current use of insulin [facility])   Primary Care at [facility], Asencion Partridge, MD   1 year ago Elevated LDL cholesterol level   Primary Care at [facility], Asencion Partridge, MD   1 year ago Type 2 diabetes mellitus with hyperglycemia, without long-term current use of insulin [facility])   Primary Care at [facility], Asencion Partridge, MD   1 year ago COVID-19 virus infection   Primary Care at [facility], Asencion Partridge, MD

## 2021-01-26 ENCOUNTER — Other Ambulatory Visit: Payer: Self-pay | Admitting: Family Medicine

## 2021-01-26 DIAGNOSIS — E785 Hyperlipidemia, unspecified: Secondary | ICD-10-CM

## 2021-01-28 ENCOUNTER — Other Ambulatory Visit: Payer: Self-pay | Admitting: Family Medicine

## 2021-01-28 DIAGNOSIS — E785 Hyperlipidemia, unspecified: Secondary | ICD-10-CM

## 2021-06-22 ENCOUNTER — Other Ambulatory Visit (HOSPITAL_COMMUNITY): Payer: Self-pay | Admitting: Student

## 2021-06-22 DIAGNOSIS — E785 Hyperlipidemia, unspecified: Secondary | ICD-10-CM

## 2021-06-24 ENCOUNTER — Ambulatory Visit (HOSPITAL_COMMUNITY)
Admission: RE | Admit: 2021-06-24 | Discharge: 2021-06-24 | Disposition: A | Discharge status: Home / Self Care | Payer: Medicare Other | Source: Ambulatory Visit | Attending: Student | Admitting: Student

## 2021-06-24 ENCOUNTER — Other Ambulatory Visit: Payer: Self-pay

## 2021-06-24 DIAGNOSIS — E785 Hyperlipidemia, unspecified: Secondary | ICD-10-CM | POA: Insufficient documentation

## 2021-06-24 DIAGNOSIS — E1169 Type 2 diabetes mellitus with other specified complication: Secondary | ICD-10-CM | POA: Insufficient documentation

## 2021-06-24 LAB — ECHOCARDIOGRAM COMPLETE
Area-P 1/2: 3.89 cm2
S' Lateral: 2 cm

## 2021-07-16 ENCOUNTER — Other Ambulatory Visit: Payer: Self-pay

## 2021-07-16 ENCOUNTER — Emergency Department (HOSPITAL_BASED_OUTPATIENT_CLINIC_OR_DEPARTMENT_OTHER): Payer: Medicare Other | Admitting: Radiology

## 2021-07-16 ENCOUNTER — Encounter (HOSPITAL_BASED_OUTPATIENT_CLINIC_OR_DEPARTMENT_OTHER): Payer: Self-pay | Admitting: Emergency Medicine

## 2021-07-16 DIAGNOSIS — Y92410 Unspecified street and highway as the place of occurrence of the external cause: Secondary | ICD-10-CM | POA: Diagnosis not present

## 2021-07-16 DIAGNOSIS — S59911A Unspecified injury of right forearm, initial encounter: Secondary | ICD-10-CM | POA: Diagnosis present

## 2021-07-16 DIAGNOSIS — Z79899 Other long term (current) drug therapy: Secondary | ICD-10-CM | POA: Diagnosis not present

## 2021-07-16 DIAGNOSIS — S52691A Other fracture of lower end of right ulna, initial encounter for closed fracture: Secondary | ICD-10-CM | POA: Insufficient documentation

## 2021-07-16 DIAGNOSIS — Z7984 Long term (current) use of oral hypoglycemic drugs: Secondary | ICD-10-CM | POA: Diagnosis not present

## 2021-07-16 DIAGNOSIS — M79631 Pain in right forearm: Secondary | ICD-10-CM | POA: Diagnosis not present

## 2021-07-16 DIAGNOSIS — T1490XA Injury, unspecified, initial encounter: Secondary | ICD-10-CM
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
## 2021-07-17 ENCOUNTER — Emergency Department (HOSPITAL_BASED_OUTPATIENT_CLINIC_OR_DEPARTMENT_OTHER): Payer: Medicare Other | Admitting: Radiology

## 2021-07-17 ENCOUNTER — Emergency Department (HOSPITAL_BASED_OUTPATIENT_CLINIC_OR_DEPARTMENT_OTHER)
Admission: EM | Admit: 2021-07-17 | Discharge: 2021-07-17 | Disposition: A | Discharge status: Home / Self Care | Payer: Medicare Other | Attending: Emergency Medicine | Admitting: Emergency Medicine

## 2021-07-17 ENCOUNTER — Encounter (HOSPITAL_BASED_OUTPATIENT_CLINIC_OR_DEPARTMENT_OTHER): Payer: Self-pay | Admitting: Emergency Medicine

## 2021-07-17 DIAGNOSIS — T1490XA Injury, unspecified, initial encounter: Secondary | ICD-10-CM

## 2021-07-17 DIAGNOSIS — S52601A Unspecified fracture of lower end of right ulna, initial encounter for closed fracture: Secondary | ICD-10-CM

## 2021-07-17 MED ORDER — ACETAMINOPHEN 500 MG PO TABS
1000.0000 mg | ORAL_TABLET | Freq: Once | ORAL | Status: AC
  Administered 2021-07-17: 1000 mg via ORAL
  Filled 2021-07-17: qty 2

## 2021-07-17 MED ORDER — TRAMADOL HCL 50 MG PO TABS
50.0000 mg | ORAL_TABLET | Freq: Once | ORAL | Status: AC
  Administered 2021-07-17: 50 mg via ORAL
  Filled 2021-07-17: qty 1

## 2021-07-17 MED ORDER — TRAMADOL HCL 50 MG PO TABS
50.0000 mg | ORAL_TABLET | Freq: Four times a day (QID) | ORAL | 0 refills | Status: DC | PRN
Start: 2021-07-17 — End: 2023-09-25

## 2021-07-17 MED ORDER — IBUPROFEN 800 MG PO TABS
800.0000 mg | ORAL_TABLET | Freq: Once | ORAL | Status: AC
Start: 2021-07-17 — End: 2021-07-17
  Administered 2021-07-17: 800 mg via ORAL
  Filled 2021-07-17: qty 1

## 2021-07-17 MED ORDER — LIDOCAINE 5 % EX PTCH
3.0000 | MEDICATED_PATCH | CUTANEOUS | Status: DC
  Administered 2021-07-17: 3 via TRANSDERMAL
  Filled 2021-07-17: qty 3
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
## 2022-04-14 LAB — EXTERNAL GENERIC LAB PROCEDURE: COLOGUARD: NEGATIVE

## 2022-04-14 LAB — COLOGUARD: COLOGUARD: NEGATIVE
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
## 2022-05-23 DIAGNOSIS — H4051X3 Glaucoma secondary to other eye disorders, right eye, severe stage: Secondary | ICD-10-CM | POA: Insufficient documentation

## 2023-02-23 DIAGNOSIS — E113292 Type 2 diabetes mellitus with mild nonproliferative diabetic retinopathy without macular edema, left eye: Secondary | ICD-10-CM | POA: Insufficient documentation

## 2023-02-23 DIAGNOSIS — E113391 Type 2 diabetes mellitus with moderate nonproliferative diabetic retinopathy without macular edema, right eye: Secondary | ICD-10-CM | POA: Insufficient documentation

## 2023-09-04 DIAGNOSIS — H401133 Primary open-angle glaucoma, bilateral, severe stage: Secondary | ICD-10-CM | POA: Diagnosis not present

## 2023-09-22 ENCOUNTER — Encounter: Payer: Self-pay | Admitting: Family Medicine

## 2023-09-25 ENCOUNTER — Encounter: Payer: Self-pay | Admitting: Family Medicine

## 2023-09-25 ENCOUNTER — Ambulatory Visit (INDEPENDENT_AMBULATORY_CARE_PROVIDER_SITE_OTHER): Payer: Medicare Other | Admitting: Family Medicine

## 2023-09-25 VITALS — BP 108/68 | HR 76 | Temp 98.3°F | Ht 68.0 in | Wt 168.4 lb

## 2023-09-25 DIAGNOSIS — E538 Deficiency of other specified B group vitamins: Secondary | ICD-10-CM | POA: Diagnosis not present

## 2023-09-25 DIAGNOSIS — Z7984 Long term (current) use of oral hypoglycemic drugs: Secondary | ICD-10-CM

## 2023-09-25 DIAGNOSIS — E113393 Type 2 diabetes mellitus with moderate nonproliferative diabetic retinopathy without macular edema, bilateral: Secondary | ICD-10-CM | POA: Diagnosis not present

## 2023-09-25 LAB — URINALYSIS, ROUTINE W REFLEX MICROSCOPIC
Bilirubin Urine: NEGATIVE
Hgb urine dipstick: NEGATIVE
Ketones, ur: NEGATIVE
Leukocytes,Ua: NEGATIVE
Nitrite: NEGATIVE
RBC / HPF: NONE SEEN (ref 0–?)
Specific Gravity, Urine: 1.025 (ref 1.000–1.030)
Total Protein, Urine: NEGATIVE
Urine Glucose: >=1000 — AB
Urobilinogen, UA: 0.2 (ref 0.0–1.0)
pH: 6 (ref 5.0–8.0)

## 2023-09-25 LAB — LIPID PANEL
Cholesterol: 165 mg/dL (ref 0–200)
HDL: 45.6 mg/dL (ref >39.00)
LDL Cholesterol: 104 mg/dL — ABNORMAL HIGH (ref 0–99)
NonHDL: 118.95
Total CHOL/HDL Ratio: 4
Triglycerides: 73 mg/dL (ref 0.0–149.0)
VLDL: 14.6 mg/dL (ref 0.0–40.0)

## 2023-09-25 LAB — HEMOGLOBIN A1C: Hgb A1c MFr Bld: 8.4 % — ABNORMAL HIGH (ref 4.6–6.5)

## 2023-09-25 LAB — BASIC METABOLIC PANEL WITH GFR
BUN: 13 mg/dL (ref 6–23)
CO2: 28 meq/L (ref 19–32)
Calcium: 8.9 mg/dL (ref 8.4–10.5)
Chloride: 102 meq/L (ref 96–112)
Creatinine, Ser: 0.91 mg/dL (ref 0.40–1.50)
GFR: 85.71 mL/min (ref >60.00)
Glucose, Bld: 273 mg/dL — ABNORMAL HIGH (ref 70–99)
Potassium: 4.1 meq/L (ref 3.5–5.1)
Sodium: 137 meq/L (ref 135–145)

## 2023-09-25 LAB — MICROALBUMIN / CREATININE URINE RATIO
Creatinine,U: 188.8 mg/dL
Microalb Creat Ratio: 3.8 mg/g (ref 0.0–30.0)
Microalb, Ur: 0.7 mg/dL (ref 0.0–1.9)

## 2023-09-25 LAB — VITAMIN B12: Vitamin B-12: 1042 pg/mL — ABNORMAL HIGH (ref 211–911)

## 2023-09-25 MED ORDER — PIOGLITAZONE HCL 30 MG PO TABS: 30.0000 mg | ORAL_TABLET | Freq: Every day | ORAL | 3 refills | Status: AC

## 2023-09-25 NOTE — Addendum Note (Signed)
 Addended by: Loyola Mast on: 09/25/2023 05:45 PM   Modules accepted: Orders

## 2023-09-25 NOTE — Assessment & Plan Note (Signed)
 I will reassess his Vitamin B12 level. Continue daily supplement.

## 2023-09-25 NOTE — Assessment & Plan Note (Signed)
 I will check annual DM labs today. Continue metformin 1,000 mg bid. If his A1c is elevated, will try and prescribe a more affordable alternative to Jardiance.

## 2023-09-25 NOTE — Progress Notes (Signed)
 [facility] [address] [place] [address] Dept: [phone] Dept Fax: [phone]  New Patient Office Visit  Subjective:    Patient ID: Randy Mcbride, male    DOB: 1956-01-14, 68 y.o..   MRN: [id number]  Chief Complaint  Patient presents with   Establish Care    NP-establish care.   No concerns.    History of Present Illness:  Patient is in today to establish care. Mr. Treece was born in [place]. He attended an Optician, dispensing school Environmental education officer) for about 1 year after high school. 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 After his discharge, he attended [employer] and got an AA in business communications. He worked for [employer] for a number of years and then the [employer]. 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 Mr. Louque has 4 children ([id number]) and 11 grandchildren. He has been married for 51 years. He denies use of tobacco and only occasionally drinks alcohol.  Mr. Sermon has a history of Type 2 diabetes. He is managed on metformin 1,000 mg bid. He had been prescribed empagliflozin 10 mg daily. He has been off of this since Feb, due to an increase in his co-pay.  Mr. Bartoli has a history of hyperlipidemia. he is managed on atorvastatin 10 mg daily.  Mr. Dreibelbis has a history of Vitamin B12 deficiency and is on an oral Vitamin B12 tablet.  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) 02/23/2023   Mild nonproliferative diabetic retinopathy of left eye without macular edema associated with type 2 diabetes mellitus (HCC) 02/23/2023   Neovascular glaucoma of right eye, severe stage 05/23/2022   Vitamin B12 deficiency 07/13/2015   Primary open angle glaucoma (POAG)  of both eyes, severe stage 10/01/2014   Type 2 diabetes mellitus with retinopathy (HCC) 12/25/2013   Past Surgical History:  Procedure Laterality Date   CATARACT EXTRACTION, BILATERAL     KNEE ARTHROSCOPY     ORIF ORBITAL FRACTURE Left    Family History  Problem Relation Age of Onset   Diabetes Mother    Hypertension Mother    Hearing loss Mother    Vision loss Mother    Stroke Father    Crohn's disease Brother    Cancer Maternal Aunt    Diabetes Maternal Grandmother    Tuberculosis Maternal Grandfather    Hypertension Paternal Grandfather    Outpatient Medications Prior to Visit  Medication Sig Dispense Refill   atorvastatin (LIPITOR) 10 MG tablet Take 1 tablet by mouth once daily 90 tablet 0   Blood Glucose Monitoring Suppl (ACCU-CHEK GUIDE) w/Device KIT daily.     brimonidine (ALPHAGAN) 0.15 % ophthalmic solution 1 drop 3 (three) times daily.     dorzolamide-timolol (COSOPT) 22.3-6.8 MG/ML ophthalmic solution Place 1 drop into both eyes 2 (two) times daily.     fluticasone (FLONASE) 50 MCG/ACT nasal spray Place 2 sprays into both nostrils daily.     JARDIANCE 10 MG TABS tablet Take 10 mg by mouth every morning.     Lancets 30G MISC Use as needed for diabetic checks 200 each 3   levocetirizine (XYZAL) 5 MG tablet Take 5 mg by mouth every evening.     metFORMIN (GLUCOPHAGE) 1000 MG tablet Take 1 tablet (  1,000 mg total) by mouth 2 (two) times daily with a meal. 180 tablet 3   Multiple Vitamin (MULTIVITAMIN) tablet Take 1 tablet by mouth daily.     sildenafil (VIAGRA) 100 MG tablet Take 100 mg by mouth as needed.     vitamin B-12 (CYANOCOBALAMIN) 500 MCG tablet Take by mouth.     Blood Glucose Monitoring Suppl (GNP EASY TOUCH GLUCOSE METER) DEVI 1 Device by Does not apply route 2 (two) times daily. 1 Device 0   glucose blood (ONETOUCH VERIO) test strip USE 3 TO 4 TIMES DAILY AS DIRECTED BY PHYSICIAN 200 each 3   latanoprost (XALATAN) 0.005 % ophthalmic solution 1 drop at bedtime.      tobramycin (TOBREX) 0.3 % ophthalmic solution every 4 (four) hours. (Patient not taking: Reported on 02/06/2020)     traMADol (ULTRAM) 50 MG tablet Take 1 tablet (50 mg total) by mouth every 6 (six) hours as needed for severe pain. 7 tablet 0   No facility-administered medications prior to visit.   No Known Allergies Objective:   Today's Vitals   09/25/23 1309  BP: 108/68  Pulse: 76  Temp: 98.3 F (36.8 C)  TempSrc: Temporal  SpO2: 98%  Weight: 168 lb 6.4 oz (76.4 kg)  Height: 5\' 8"  (1.727 m)   Body mass index is 25.61 kg/m.   General: Well developed, well nourished. No acute distress. Feet- Skin intact. No sign of maceration between toes. Great toe nails are thickened and discolored. Dorsalis   pedis and posterior tibial artery pulses are normal. 5.07 monofilament testing normal. Psych: Alert and oriented. Normal mood and affect.  Health Maintenance Due  Topic Date Due   Medicare Annual Wellness (AWV)  Never done   Zoster Vaccines- Shingrix (1 of 2) Never done   OPHTHALMOLOGY EXAM  10/30/2015     Assessment & Plan:   Problem List Items Addressed This Visit       Endocrine   Type 2 diabetes mellitus with retinopathy (HCC) - Primary   I will check annual DM labs today. Continue metformin 1,000 mg bid. If his A1c is elevated, will try and prescribe a more affordable alternative to Jardiance.      Relevant Medications   JARDIANCE 10 MG TABS tablet   Other Relevant Orders   Lipid panel (Completed)   Microalbumin / creatinine urine ratio (Completed)   Basic metabolic panel with GFR (Completed)   Hemoglobin A1c (Completed)   Urinalysis, Routine w reflex microscopic (Completed)     Other   Vitamin B12 deficiency   I will reassess his Vitamin B12 level. Continue daily supplement.      Relevant Orders   Vitamin B12 (Completed)    Return in about 3 months (around 12/25/2023) for Reassessment.   Loyola Mast, MD

## 2024-01-16 ENCOUNTER — Telehealth: Payer: Self-pay

## 2024-01-16 NOTE — Telephone Encounter (Signed)
 Patient was identified as falling into the [place] Measure - Diabetes.   Patient was: Appointment already scheduled for:  04/01/2024.

## 2024-01-17 ENCOUNTER — Other Ambulatory Visit: Payer: Self-pay | Admitting: Family Medicine

## 2024-01-17 DIAGNOSIS — E113393 Type 2 diabetes mellitus with moderate nonproliferative diabetic retinopathy without macular edema, bilateral: Secondary | ICD-10-CM

## 2024-01-23 ENCOUNTER — Other Ambulatory Visit: Payer: Self-pay | Admitting: Family Medicine

## 2024-01-23 NOTE — Telephone Encounter (Unsigned)
 Copied from CRM [id number]. Topic: Clinical - Medication Refill >> Jan 23, 2024  4:07 PM Aisha D wrote: Medication: sildenafil  (VIAGRA ) 100 MG tablet  Has the patient contacted their pharmacy? Yes (Agent: If no, request that the patient contact the pharmacy for the refill. If patient does not wish to contact the pharmacy document the reason why and proceed with request.) (Agent: If yes, when and what did the pharmacy advise?)  This is the patient's preferred pharmacy:  [id number] - [place] ([place]), [place] - [address]  ([place]) [place] [address] Phone: [phone] Fax: [phone]  Is this the correct pharmacy for this prescription? Yes If no, delete pharmacy and type the correct one.   Has the prescription been filled recently? No  Is the patient out of the medication? Yes  Has the patient been seen for an appointment in the last year OR does the patient have an upcoming appointment? Yes  Can we respond through MyChart? Yes  Agent: Please be advised that Rx refills may take up to 3 business days. We ask that you follow-up with your pharmacy.

## 2024-01-24 MED ORDER — SILDENAFIL CITRATE 100 MG PO TABS: 100.0000 mg | ORAL_TABLET | ORAL | 5 refills | Status: AC | PRN

## 2024-01-24 NOTE — Telephone Encounter (Signed)
 Refill request for  Viagra  100b mg LR  HX provider LOV 09/25/23 FOV   none scheduled.   Please review and advise.  Thanks Dm/cma

## 2024-02-29 DIAGNOSIS — H401133 Primary open-angle glaucoma, bilateral, severe stage: Secondary | ICD-10-CM | POA: Diagnosis not present

## 2024-02-29 DIAGNOSIS — E113391 Type 2 diabetes mellitus with moderate nonproliferative diabetic retinopathy without macular edema, right eye: Secondary | ICD-10-CM | POA: Diagnosis not present

## 2024-02-29 DIAGNOSIS — Z961 Presence of intraocular lens: Secondary | ICD-10-CM | POA: Diagnosis not present

## 2024-02-29 DIAGNOSIS — E113292 Type 2 diabetes mellitus with mild nonproliferative diabetic retinopathy without macular edema, left eye: Secondary | ICD-10-CM | POA: Diagnosis not present

## 2024-04-01 ENCOUNTER — Ambulatory Visit

## 2024-04-01 VITALS — BP 124/66 | HR 73 | Temp 98.2°F | Ht 67.5 in | Wt 175.0 lb

## 2024-04-01 DIAGNOSIS — Z0000 Encounter for general adult medical examination without abnormal findings: Secondary | ICD-10-CM

## 2024-04-01 DIAGNOSIS — Z23 Encounter for immunization: Secondary | ICD-10-CM | POA: Diagnosis not present

## 2024-04-01 NOTE — Progress Notes (Signed)
 Subjective:   Randy Mcbride is a 68 y.o. who presents for a Medicare Wellness preventive visit.  As a reminder, Annual Wellness Visits don't include a physical exam, and some assessments may be limited, especially if this visit is performed virtually. We may recommend an in-person follow-up visit with your provider if needed.  Visit Complete: In person    Persons Participating in Visit: Patient.  AWV Questionnaire: Yes: Patient Medicare AWV questionnaire was completed by the patient on 04/01/2024; I have confirmed that all information answered by patient is correct and no changes since this date.  Cardiac Risk Factors include: advanced age (>45men, >73 women);diabetes mellitus;male gender     Objective:    Today's Vitals   04/01/24 1042  BP: 124/66  Pulse: 73  Temp: 98.2 F (36.8 C)  TempSrc: Oral  SpO2: 98%  Weight: 175 lb (79.4 kg)  Height: 5' 7.5 (1.715 m)   Body mass index is 27 kg/m.     04/01/2024   10:47 AM 07/16/2021   10:11 PM  Advanced Directives  Does Patient Have a Medical Advance Directive? No No    Current Medications (verified) Outpatient Encounter Medications as of 04/01/2024  Medication Sig   atorvastatin  (LIPITOR) 10 MG tablet Take 1 tablet by mouth once daily   Blood Glucose Monitoring Suppl (ACCU-CHEK GUIDE) w/Device KIT daily.   brimonidine (ALPHAGAN) 0.15 % ophthalmic solution 1 drop 3 (three) times daily.   dorzolamide-timolol (COSOPT) 22.3-6.8 MG/ML ophthalmic solution Place 1 drop into both eyes 2 (two) times daily.   fluticasone (FLONASE) 50 MCG/ACT nasal spray Place 2 sprays into both nostrils daily.   JARDIANCE 10 MG TABS tablet Take 10 mg by mouth every morning.   Lancets 30G MISC Use as needed for diabetic checks   levocetirizine (XYZAL) 5 MG tablet Take 5 mg by mouth every evening.   metFORMIN  (GLUCOPHAGE ) 1000 MG tablet Take 1 tablet (1,000 mg total) by mouth 2 (two) times daily with a meal.   Multiple Vitamin (MULTIVITAMIN)  tablet Take 1 tablet by mouth daily.   pioglitazone  (ACTOS ) 30 MG tablet Take 1 tablet (30 mg total) by mouth daily.   sildenafil  (VIAGRA ) 100 MG tablet Take 1 tablet (100 mg total) by mouth as needed.   vitamin B-12 (CYANOCOBALAMIN ) 500 MCG tablet Take by mouth.   No facility-administered encounter medications on file as of 04/01/2024.    Allergies (verified) Patient has no known allergies.   History: Past Medical History:  Diagnosis Date   Cataract    Diabetes mellitus without complication (HCC)    Glaucoma    Past Surgical History:  Procedure Laterality Date   CATARACT EXTRACTION, BILATERAL     KNEE ARTHROSCOPY     ORIF ORBITAL FRACTURE Left    Family History  Problem Relation Age of Onset   Diabetes Mother    Hypertension Mother    Hearing loss Mother    Vision loss Mother    Stroke Father    Crohn's disease Brother    Cancer Maternal Aunt    Diabetes Maternal Grandmother    Tuberculosis Maternal Grandfather    Hypertension Paternal Grandfather    Social History   Socioeconomic History   Marital status: Married    Spouse name: Not on file   Number of children: 4   Years of education: Not on file   Highest education level: Associate degree: academic program  Occupational History   Not on file  Tobacco Use   Smoking status: Former  Current packs/day: 0.00    Types: Cigarettes    Quit date: 06/26/1977    Years since quitting: 46.7   Smokeless tobacco: Never  Vaping Use   Vaping status: Never Used  Substance and Sexual Activity   Alcohol use: Not Currently    Alcohol/week: 1.0 standard drink of alcohol    Types: 1 Standard drinks or equivalent per week    Comment: drink on weekend   Drug use: No   Sexual activity: Yes    Birth control/protection: None  Other Topics Concern   Not on file  Social History Narrative   Not on file   Social Drivers of Health   Financial Resource Strain: Low Risk  (04/01/2024)   Overall Financial Resource Strain  (CARDIA)    Difficulty of Paying Living Expenses: Not very hard  Food Insecurity: No Food Insecurity (04/01/2024)   Hunger Vital Sign    Worried About Running Out of Food in the Last Year: Never true    Ran Out of Food in the Last Year: Never true  Transportation Needs: No Transportation Needs (04/01/2024)   PRAPARE - Administrator, Civil Service (Medical): No    Lack of Transportation (Non-Medical): No  Physical Activity: Insufficiently Active (04/01/2024)   Exercise Vital Sign    Days of Exercise per Week: 2 days    Minutes of Exercise per Session: 50 min  Stress: No Stress Concern Present (04/01/2024)   [employer] of Occupational Health - Occupational Stress Questionnaire    Feeling of Stress: Not at all  Social Connections: Socially Integrated (04/01/2024)   Social Connection and Isolation Panel    Frequency of Communication with Friends and Family: More than three times a week    Frequency of Social Gatherings with Friends and Family: More than three times a week    Attends Religious Services: More than 4 times per year    Active Member of [employer] or Organizations: Yes    Attends Engineer, structural: More than 4 times per year    Marital Status: Married    Tobacco Counseling Counseling given: Not Answered    Clinical Intake:  Pre-visit preparation completed: Yes  Pain : No/denies pain     Nutritional Status: BMI 25 -29 Overweight Nutritional Risks: None Diabetes: No  Lab Results  Component Value Date   HGBA1C 8.4 (H) 09/25/2023   HGBA1C 7.6 (H) 02/06/2020   HGBA1C 8.7 (H) 03/21/2019     How often do you need to have someone help you when you read instructions, pamphlets, or other written materials from your doctor or pharmacy?: 1 - Never  Interpreter Needed?: No  Information entered by :: NAllen LPN   Activities of Daily Living     04/01/2024    6:37 AM  In your present state of health, do you have any difficulty performing  the following activities:  Hearing? 0  Vision? 0  Difficulty concentrating or making decisions? 0  Walking or climbing stairs? 0  Dressing or bathing? 0  Doing errands, shopping? 0  Preparing Food and eating ? N  Using the Toilet? N  In the past six months, have you accidently leaked urine? N  Do you have problems with loss of bowel control? N  Managing your Medications? N  Managing your Finances? N  Housekeeping or managing your Housekeeping? N    Patient Care Team: Thedora Garnette HERO, MD as PCP - General (Family Medicine) Bond, Reyes Mcardle, MD as Referring Physician (Ophthalmology)  I have updated your Care Teams any recent Medical Services you may have received from other providers in the past year.     Assessment:   This is a routine wellness examination for Randy Mcbride.  Hearing/Vision screen Hearing Screening - Comments:: Denies hearing issues Vision Screening - Comments:: Regular eye exam, Dr. Juliane Richmond   Goals Addressed             This Visit's Progress    Patient Stated       04/01/2024, stay alive       Depression Screen     04/01/2024   10:50 AM 09/25/2023    1:30 PM 02/06/2020    1:43 PM 05/02/2019    9:17 AM 03/21/2019    4:50 PM 01/23/2019   11:30 AM 10/05/2018    9:26 AM  PHQ 2/9 Scores  PHQ - 2 Score 0 0 0 0 0 0 0  PHQ- 9 Score  0   0      Fall Risk     04/01/2024    6:37 AM 09/25/2023    1:30 PM 02/06/2020    1:42 PM 05/02/2019    9:17 AM 03/21/2019    4:50 PM  Fall Risk   Falls in the past year? 0 0 0 0  0   Number falls in past yr: 0 0 0 0  0   Injury with Fall? 0 0 0 0   Risk for fall due to : Medication side effect No Fall Risks     Follow up Falls prevention discussed;Falls evaluation completed Falls evaluation completed Falls evaluation completed  Falls evaluation completed  Falls evaluation completed      Data saved with a previous flowsheet row definition    MEDICARE RISK AT HOME:  Medicare Risk at Home Any stairs in or around  the home?: (Patient-Rptd) Yes If so, are there any without handrails?: (Patient-Rptd) No Home free of loose throw rugs in walkways, pet beds, electrical cords, etc?: (Patient-Rptd) Yes Adequate lighting in your home to reduce risk of falls?: (Patient-Rptd) Yes Life alert?: (Patient-Rptd) No Use of a cane, walker or w/c?: (Patient-Rptd) No Grab bars in the bathroom?: (Patient-Rptd) No Shower chair or bench in shower?: (Patient-Rptd) No Elevated toilet seat or a handicapped toilet?: (Patient-Rptd) No  TIMED UP AND GO:  Was the test performed?  Yes  Length of time to ambulate 10 feet: 5 sec Gait steady and fast without use of assistive device  Cognitive Function: 6CIT completed        04/01/2024   10:50 AM  6CIT Screen  What Year? 0 points  What month? 0 points  What time? 0 points  Count back from 20 0 points  Months in reverse 0 points  Repeat phrase 0 points  Total Score 0 points    Immunizations Immunization History  Administered Date(s) Administered   DTaP 05/10/1977, 09/15/1977, 12/20/1977   Fluad Quad(high Dose 65+) 05/02/2019   IPV 05/10/1977, 09/15/1977, 12/20/1977   Influenza Split 06/16/2023   Influenza,inj,Quad PF,6+ Mos 07/13/2015   PFIZER(Purple Top)SARS-COV-2 Vaccination 08/10/2019, 09/03/2019, 03/28/2020   PNEUMOCOCCAL CONJUGATE-20 04/15/2022   Pneumococcal Polysaccharide-23 11/13/2014   Tdap 11/13/2014, 10/17/2023   Unspecified SARS-COV-2 Vaccination 06/16/2023   Zoster Recombinant(Shingrix) 10/17/2023, 12/29/2023    Screening Tests Health Maintenance  Topic Date Due   OPHTHALMOLOGY EXAM  10/30/2015   Influenza Vaccine  01/19/2024   COVID-19 Vaccine (5 - 2025-26 season) 02/19/2024   HEMOGLOBIN A1C  03/26/2024   Diabetic kidney evaluation -  eGFR measurement  09/24/2024   Diabetic kidney evaluation - Urine ACR  09/24/2024   FOOT EXAM  09/24/2024   Medicare Annual Wellness (AWV)  04/01/2025   Fecal DNA (Cologuard)  04/07/2025   DTaP/Tdap/Td (6 -  Td or Tdap) 10/16/2033   Pneumococcal Vaccine: 50+ Years  Completed   Hepatitis C Screening  Completed   Zoster Vaccines- Shingrix  Completed   Meningococcal B Vaccine  Aged Out   Colonoscopy  Discontinued    Health Maintenance Items Addressed: Vaccines Given today: flu, Requesting eye exam  Additional Screening:  Vision Screening: Recommended annual ophthalmology exams for early detection of glaucoma and other disorders of the eye. Is the patient up to date with their annual eye exam?  Yes  Who is the provider or what is the name of the office in which the patient attends annual eye exams? Dr. Geneva  Dental Screening: Recommended annual dental exams for proper oral hygiene  Community Resource Referral / Chronic Care Management: CRR required this visit?  No   CCM required this visit?  No   Plan:    I have personally reviewed and noted the following in the patient's chart:   Medical and social history Use of alcohol, tobacco or illicit drugs  Current medications and supplements including opioid prescriptions. Patient is not currently taking opioid prescriptions. Functional ability and status Nutritional status Physical activity Advanced directives List of other physicians Hospitalizations, surgeries, and ER visits in previous 12 months Vitals Screenings to include cognitive, depression, and falls Referrals and appointments  In addition, I have reviewed and discussed with patient certain preventive protocols, quality metrics, and best practice recommendations. A written personalized care plan for preventive services as well as general preventive health recommendations were provided to patient.   Randy FORBES Dawn, LPN   [id number]   After Visit Summary: (In Person-Printed) AVS printed and given to the patient  Notes: Nothing significant to report at this time.

## 2024-04-01 NOTE — Patient Instructions (Addendum)
 Randy Mcbride,  Thank you for taking the time for your Medicare Wellness Visit. I appreciate your continued commitment to your health goals. Please review the care plan we discussed, and feel free to reach out if I can assist you further.  Medicare recommends these wellness visits once per year to help you and your care team stay ahead of potential health issues. These visits are designed to focus on prevention, allowing your provider to concentrate on managing your acute and chronic conditions during your regular appointments.  Please note that Annual Wellness Visits do not include a physical exam. Some assessments may be limited, especially if the visit was conducted virtually. If needed, we may recommend a separate in-person follow-up with your provider.  Ongoing Care Seeing your primary care provider every 3 to 6 months helps us  monitor your health and provide consistent, personalized care.   Referrals If a referral was made during today's visit and you haven't received any updates within two weeks, please contact the referred provider directly to check on the status.  Recommended Screenings:  Health Maintenance  Topic Date Due   Eye exam for diabetics  10/30/2015   COVID-19 Vaccine (5 - 2025-26 season) 02/19/2024   Hemoglobin A1C  03/26/2024   Yearly kidney function blood test for diabetes  09/24/2024   Yearly kidney health urinalysis for diabetes  09/24/2024   Complete foot exam   09/24/2024   Medicare Annual Wellness Visit  04/01/2025   Cologuard (Stool DNA test)  04/07/2025   DTaP/Tdap/Td vaccine (6 - Td or Tdap) 10/16/2033   Pneumococcal Vaccine for age over 55  Completed   Flu Shot  Completed   Hepatitis C Screening  Completed   Zoster (Shingles) Vaccine  Completed   Meningitis B Vaccine  Aged Out   Colon Cancer Screening  Discontinued       04/01/2024   10:47 AM  Advanced Directives  Does Patient Have a Medical Advance Directive? No   Advance Care Planning is  important because it: Ensures you receive medical care that aligns with your values, goals, and preferences. Provides guidance to your family and loved ones, reducing the emotional burden of decision-making during critical moments.  Vision: Annual vision screenings are recommended for early detection of glaucoma, cataracts, and diabetic retinopathy. These exams can also reveal signs of chronic conditions such as diabetes and high blood pressure.  Dental: Annual dental screenings help detect early signs of oral cancer, gum disease, and other conditions linked to overall health, including heart disease and diabetes.  Please see the attached documents for additional preventive care recommendations.

## 2024-05-01 NOTE — Assessment & Plan Note (Signed)
 I will reassess his Vitamin B12 level. Continue daily supplement.

## 2024-05-01 NOTE — Progress Notes (Signed)
 [facility] [address] [place] [address] Dept: [phone] Dept Fax: [phone]  Chronic Care Office Visit  Subjective:    Patient ID: Randy Mcbride, male    DOB: [birth year], 68 y.o..   MRN: [id number]  Chief Complaint  Patient presents with   Diabetes    F/u DM.  Average BS at home 122, 132, 177.  C/o having LT arm numb/tingling off/on x 6 weeks.  Fasting today.    History of Present Illness:  Patient is in today for reassessment of chronic medical conditions.   Randy Mcbride has a history of Type 2 diabetes. He is managed on metformin  1,000 mg bid and pioglitazone  30 mg daily. He had been prescribed empagliflozin 10 mg daily in the past, but stopped this due to cost.    Randy Mcbride has a history of hyperlipidemia. He is prescribed atorvastatin  10 mg daily, but apparently has not been taking this.   Randy Mcbride has a history of Vitamin B12 deficiency and is on an oral Vitamin B12 tablets.  Randy Mcbride notes that he has been having left shoulder pain for the past 6 weeks. The pain has been intermittent. He had tried massaging this with black seed oil, which seemed to help. More recently, he notes the pain is radiating down the left arm and occasionally causing numbness in his hand. He is not awakening with this.   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) 12/25/2013   Past Surgical History:   Procedure Laterality Date   CATARACT EXTRACTION, BILATERAL     KNEE ARTHROSCOPY     ORIF ORBITAL FRACTURE Left    Family History  Problem Relation Age of Onset   Diabetes Mother    Hypertension Mother    Hearing loss Mother    Vision loss Mother    Stroke Father    Crohn's disease Brother    Cancer Maternal Aunt    Diabetes Maternal Grandmother    Tuberculosis Maternal Grandfather    Hypertension Paternal Grandfather    Outpatient Medications Prior to Visit  Medication Sig Dispense Refill   atorvastatin  (LIPITOR) 10 MG tablet Take 1 tablet by mouth once daily 90 tablet 0   Blood Glucose Monitoring Suppl (ACCU-CHEK GUIDE) w/Device KIT daily.     brimonidine (ALPHAGAN) 0.15 % ophthalmic solution 1 drop 3 (three) times daily.     dorzolamide-timolol (COSOPT) 22.3-6.8 MG/ML ophthalmic solution Place 1 drop into both eyes 2 (two) times daily.     fluticasone (FLONASE) 50 MCG/ACT nasal spray Place 2 sprays into both nostrils daily.     JARDIANCE 10 MG TABS tablet Take 10 mg by mouth every morning.     Lancets 30G MISC Use as needed for diabetic checks 200 each 3   levocetirizine (XYZAL) 5 MG tablet Take 5 mg by mouth every evening.     metFORMIN  (GLUCOPHAGE ) 1000 MG tablet Take 1 tablet (1,000 mg total) by mouth 2 (two) times daily with a meal. 180 tablet 3  Multiple Vitamin (MULTIVITAMIN) tablet Take 1 tablet by mouth daily.     pioglitazone  (ACTOS ) 30 MG tablet Take 1 tablet (30 mg total) by mouth daily. 90 tablet 3   sildenafil  (VIAGRA ) 100 MG tablet Take 1 tablet (100 mg total) by mouth as needed. 10 tablet 5   vitamin B-12 (CYANOCOBALAMIN ) 500 MCG tablet Take by mouth.     No facility-administered medications prior to visit.   No Known Allergies   Objective:   Today's Vitals   05/02/24 1013  BP: 124/72  Pulse: 63  Temp: 97.7 F (36.5 C)  TempSrc: Temporal  SpO2: 99%  Weight: 175 lb (79.4 kg)  Height: 5' 7.5 (1.715 m)   Body mass index is 27 kg/m.   General:  Well developed, well nourished. No acute distress. Extremities: Full ROM of the left shoulder joint. No crepitance in the joint. No subluxation. Empty can test is   negative. No joint swelling or tenderness. Tinel's and Phalen's tests are negative. Psych: Alert and oriented. Normal mood and affect.  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 I will check an A1c today. Continue metformin  1,000 mg bid and pioglitazone  30 mg daily.      Relevant Medications   atorvastatin  (LIPITOR) 10 MG tablet   Other Relevant Orders   Glucose, random   Hemoglobin A1c     Other   Acute pain of left shoulder   Likely is a flare of osteoarthritis. I recommend he take a 7-day course of naproxen. We discussed him using heat and range of motion exercises. Follow-up if not improving.      Relevant Medications   naproxen (NAPROSYN) 500 MG tablet   Hyperlipidemia   I recommend he restart his atorvastatin  10 mg daily.      Relevant Medications   atorvastatin  (LIPITOR) 10 MG tablet   Other Visit Diagnoses       Long term current use of oral hypoglycemic drug           Return in about 3 months (around 08/02/2024) for Reassessment.   Garnette CHRISTELLA Simpler, MD  I,Emily Lagle,acting as a scribe for Garnette CHRISTELLA Simpler, MD.,have documented all relevant documentation on the behalf of Garnette CHRISTELLA Simpler, MD.  I, Garnette CHRISTELLA Simpler, MD, have reviewed all documentation for this visit. The documentation on 05/02/2024 for the exam, diagnosis, procedures, and orders are all accurate and complete.

## 2024-05-01 NOTE — Assessment & Plan Note (Addendum)
 Diabetes has not been well controlled. I will check an A1c today. Continue metformin  1,000 mg bid and pioglitazone  30 mg daily.

## 2024-05-02 ENCOUNTER — Encounter: Payer: Self-pay | Admitting: Family Medicine

## 2024-05-02 ENCOUNTER — Ambulatory Visit: Admitting: Family Medicine

## 2024-05-02 VITALS — BP 124/72 | HR 63 | Temp 97.7°F | Ht 67.5 in | Wt 175.0 lb

## 2024-05-02 DIAGNOSIS — E113393 Type 2 diabetes mellitus with moderate nonproliferative diabetic retinopathy without macular edema, bilateral: Secondary | ICD-10-CM

## 2024-05-02 DIAGNOSIS — E785 Hyperlipidemia, unspecified: Secondary | ICD-10-CM | POA: Insufficient documentation

## 2024-05-02 DIAGNOSIS — Z7984 Long term (current) use of oral hypoglycemic drugs: Secondary | ICD-10-CM

## 2024-05-02 DIAGNOSIS — E538 Deficiency of other specified B group vitamins: Secondary | ICD-10-CM

## 2024-05-02 DIAGNOSIS — M25512 Pain in left shoulder: Secondary | ICD-10-CM | POA: Diagnosis not present

## 2024-05-02 LAB — GLUCOSE, RANDOM: Glucose, Bld: 152 mg/dL — ABNORMAL HIGH (ref 70–99)

## 2024-05-02 LAB — HEMOGLOBIN A1C: Hgb A1c MFr Bld: 7.2 % — ABNORMAL HIGH (ref 4.6–6.5)

## 2024-05-02 MED ORDER — ATORVASTATIN CALCIUM 10 MG PO TABS: 10.0000 mg | ORAL_TABLET | Freq: Every day | ORAL | 3 refills | Status: AC

## 2024-05-02 MED ORDER — NAPROXEN 500 MG PO TABS: 500.0000 mg | ORAL_TABLET | Freq: Two times a day (BID) | ORAL | 0 refills | Status: AC

## 2024-05-02 NOTE — Assessment & Plan Note (Signed)
 Likely is a flare of osteoarthritis. I recommend he take a 7-day course of naproxen. We discussed him using heat and range of motion exercises. Follow-up if not improving.

## 2024-05-02 NOTE — Assessment & Plan Note (Signed)
 I recommend he restart his atorvastatin  10 mg daily.

## 2024-05-06 ENCOUNTER — Ambulatory Visit: Payer: Self-pay | Admitting: Family Medicine

## 2024-05-06 ENCOUNTER — Telehealth: Payer: Self-pay | Admitting: Pharmacist

## 2024-05-06 NOTE — Progress Notes (Signed)
 Pharmacy Quality Measure Review  This patient is appearing on the insurance-providing list for being at risk of failing the adherence measure for Statin Use in Persons with Diabetes (SUPD) medications this calendar year.   Medication: atorvastatin  10 mg daily filled 05/05/2024. No action needed at this time  Catie IVAR Centers, PharmD, [facility] Clinical Pharmacist [phone]

## 2024-08-02 ENCOUNTER — Ambulatory Visit: Admitting: Family Medicine

## 2025-04-07 ENCOUNTER — Ambulatory Visit
